# Patient Record
Sex: Male | Born: 1950
Health system: Southern US, Community
[De-identification: ages and names within clinical notes are randomized; demographics above are authoritative.]

## PROBLEM LIST (undated history)

## (undated) DIAGNOSIS — M722 Plantar fascial fibromatosis: Secondary | ICD-10-CM

## (undated) DIAGNOSIS — S060XAA Concussion with loss of consciousness status unknown, initial encounter: Secondary | ICD-10-CM

## (undated) DIAGNOSIS — Z973 Presence of spectacles and contact lenses: Secondary | ICD-10-CM

## (undated) DIAGNOSIS — Z8619 Personal history of other infectious and parasitic diseases: Secondary | ICD-10-CM

## (undated) DIAGNOSIS — J302 Other seasonal allergic rhinitis: Secondary | ICD-10-CM

## (undated) DIAGNOSIS — B1920 Unspecified viral hepatitis C without hepatic coma: Secondary | ICD-10-CM

## (undated) HISTORY — PX: TONSILLECTOMY: SUR1361

## (undated) HISTORY — DX: Concussion with loss of consciousness status unknown, initial encounter: S06.0XAA

## (undated) HISTORY — DX: Plantar fascial fibromatosis: M72.2

## (undated) HISTORY — DX: Personal history of other infectious and parasitic diseases: Z86.19

## (undated) HISTORY — DX: Unspecified viral hepatitis C without hepatic coma: B19.20

## (undated) HISTORY — DX: Other seasonal allergic rhinitis: J30.2

## (undated) HISTORY — DX: Presence of spectacles and contact lenses: Z97.3

## (undated) HISTORY — PX: OTHER SURGICAL HISTORY: SHX169

---

## 1992-10-21 HISTORY — PX: HERNIA REPAIR: SHX51

## 1994-10-21 HISTORY — PX: EYE SURGERY: SHX253

## 2012-10-21 HISTORY — PX: FRACTURE SURGERY: SHX138

## 2013-05-15 DIAGNOSIS — S022XXA Fracture of nasal bones, initial encounter for closed fracture: Secondary | ICD-10-CM | POA: Insufficient documentation

## 2013-05-15 DIAGNOSIS — J939 Pneumothorax, unspecified: Secondary | ICD-10-CM | POA: Insufficient documentation

## 2013-05-15 DIAGNOSIS — D62 Acute posthemorrhagic anemia: Secondary | ICD-10-CM | POA: Insufficient documentation

## 2013-05-15 DIAGNOSIS — S128XXA Fracture of other parts of neck, initial encounter: Secondary | ICD-10-CM | POA: Insufficient documentation

## 2013-05-15 DIAGNOSIS — S129XXA Fracture of neck, unspecified, initial encounter: Secondary | ICD-10-CM | POA: Insufficient documentation

## 2013-05-15 DIAGNOSIS — D72829 Elevated white blood cell count, unspecified: Secondary | ICD-10-CM | POA: Insufficient documentation

## 2013-05-15 DIAGNOSIS — S5291XA Unspecified fracture of right forearm, initial encounter for closed fracture: Secondary | ICD-10-CM | POA: Insufficient documentation

## 2013-05-15 DIAGNOSIS — S42101A Fracture of unspecified part of scapula, right shoulder, initial encounter for closed fracture: Secondary | ICD-10-CM | POA: Insufficient documentation

## 2013-05-15 DIAGNOSIS — E872 Acidosis, unspecified: Secondary | ICD-10-CM | POA: Insufficient documentation

## 2013-05-15 DIAGNOSIS — R739 Hyperglycemia, unspecified: Secondary | ICD-10-CM | POA: Insufficient documentation

## 2013-05-18 DIAGNOSIS — S2249XA Multiple fractures of ribs, unspecified side, initial encounter for closed fracture: Secondary | ICD-10-CM | POA: Insufficient documentation

## 2013-05-18 DIAGNOSIS — S066XAA Traumatic subarachnoid hemorrhage with loss of consciousness status unknown, initial encounter: Secondary | ICD-10-CM | POA: Insufficient documentation

## 2013-05-18 DIAGNOSIS — R0689 Other abnormalities of breathing: Secondary | ICD-10-CM | POA: Insufficient documentation

## 2013-05-18 DIAGNOSIS — S066X1A Traumatic subarachnoid hemorrhage with loss of consciousness of 30 minutes or less, initial encounter: Secondary | ICD-10-CM | POA: Insufficient documentation

## 2013-05-20 DIAGNOSIS — S8253XA Displaced fracture of medial malleolus of unspecified tibia, initial encounter for closed fracture: Secondary | ICD-10-CM | POA: Insufficient documentation

## 2013-07-05 DIAGNOSIS — S52379A Galeazzi's fracture of unspecified radius, initial encounter for closed fracture: Secondary | ICD-10-CM | POA: Insufficient documentation

## 2015-05-18 LAB — HEMOGLOBIN A1C: Hemoglobin A1C: 5.1

## 2015-11-15 DIAGNOSIS — L718 Other rosacea: Secondary | ICD-10-CM | POA: Diagnosis not present

## 2015-11-15 DIAGNOSIS — L219 Seborrheic dermatitis, unspecified: Secondary | ICD-10-CM | POA: Diagnosis not present

## 2015-11-15 DIAGNOSIS — Z08 Encounter for follow-up examination after completed treatment for malignant neoplasm: Secondary | ICD-10-CM | POA: Diagnosis not present

## 2015-11-15 DIAGNOSIS — Z85828 Personal history of other malignant neoplasm of skin: Secondary | ICD-10-CM | POA: Diagnosis not present

## 2016-06-27 DIAGNOSIS — Z125 Encounter for screening for malignant neoplasm of prostate: Secondary | ICD-10-CM | POA: Diagnosis not present

## 2016-06-27 DIAGNOSIS — Z136 Encounter for screening for cardiovascular disorders: Secondary | ICD-10-CM | POA: Diagnosis not present

## 2016-06-27 DIAGNOSIS — Z0000 Encounter for general adult medical examination without abnormal findings: Secondary | ICD-10-CM | POA: Diagnosis not present

## 2016-06-27 DIAGNOSIS — Z131 Encounter for screening for diabetes mellitus: Secondary | ICD-10-CM | POA: Diagnosis not present

## 2017-07-04 DIAGNOSIS — Z136 Encounter for screening for cardiovascular disorders: Secondary | ICD-10-CM | POA: Diagnosis not present

## 2017-07-04 DIAGNOSIS — Z131 Encounter for screening for diabetes mellitus: Secondary | ICD-10-CM | POA: Diagnosis not present

## 2017-07-04 DIAGNOSIS — Z1159 Encounter for screening for other viral diseases: Secondary | ICD-10-CM | POA: Diagnosis not present

## 2017-07-04 DIAGNOSIS — E7889 Other lipoprotein metabolism disorders: Secondary | ICD-10-CM | POA: Diagnosis not present

## 2017-07-04 DIAGNOSIS — Z1211 Encounter for screening for malignant neoplasm of colon: Secondary | ICD-10-CM | POA: Diagnosis not present

## 2017-07-04 DIAGNOSIS — Z0000 Encounter for general adult medical examination without abnormal findings: Secondary | ICD-10-CM | POA: Diagnosis not present

## 2017-07-04 DIAGNOSIS — Z125 Encounter for screening for malignant neoplasm of prostate: Secondary | ICD-10-CM | POA: Diagnosis not present

## 2017-07-04 DIAGNOSIS — M153 Secondary multiple arthritis: Secondary | ICD-10-CM | POA: Diagnosis not present

## 2017-07-04 LAB — LIPID PANEL
Cholesterol: 172 (ref 0–200)
HDL: 77 — AB (ref 35–70)
LDL CALC: 83
Triglycerides: 60 (ref 40–160)

## 2017-07-04 LAB — BASIC METABOLIC PANEL
BUN: 13 (ref 4–21)
Creatinine: 0.8 (ref <=1.3)
Glucose: 99
Potassium: 4.9 (ref 3.4–5.3)
Sodium: 141 (ref 137–147)

## 2017-07-04 LAB — CBC AND DIFFERENTIAL
HCT: 42 (ref 41–53)
Hemoglobin: 14.9 (ref 13.5–17.5)
PLATELETS: 177 (ref 150–399)
WBC: 4.1

## 2017-07-04 LAB — HEPATIC FUNCTION PANEL
ALK PHOS: 59 (ref 25–125)
ALT: 41 — AB (ref 10–40)
AST: 32 (ref 14–40)
BILIRUBIN, TOTAL: 0.8

## 2017-07-04 LAB — PSA: PSA: 2.26

## 2017-07-10 DIAGNOSIS — Z1211 Encounter for screening for malignant neoplasm of colon: Secondary | ICD-10-CM | POA: Diagnosis not present

## 2017-07-10 DIAGNOSIS — R768 Other specified abnormal immunological findings in serum: Secondary | ICD-10-CM | POA: Diagnosis not present

## 2017-07-10 LAB — FECAL OCCULT BLOOD, GUAIAC: FECAL OCCULT BLD: NEGATIVE

## 2017-07-10 LAB — HM HEPATITIS C SCREENING LAB: HM Hepatitis Screen: POSITIVE

## 2017-08-13 DIAGNOSIS — R768 Other specified abnormal immunological findings in serum: Secondary | ICD-10-CM | POA: Diagnosis not present

## 2017-08-13 DIAGNOSIS — B1710 Acute hepatitis C without hepatic coma: Secondary | ICD-10-CM | POA: Diagnosis not present

## 2017-08-13 DIAGNOSIS — Z1211 Encounter for screening for malignant neoplasm of colon: Secondary | ICD-10-CM | POA: Diagnosis not present

## 2017-08-14 LAB — HM HIV SCREENING LAB: HM HIV Screening: NEGATIVE

## 2017-09-25 ENCOUNTER — Ambulatory Visit (INDEPENDENT_AMBULATORY_CARE_PROVIDER_SITE_OTHER): Payer: Medicare Other | Admitting: Internal Medicine

## 2017-09-25 ENCOUNTER — Encounter: Payer: Self-pay | Admitting: Internal Medicine

## 2017-09-25 DIAGNOSIS — B182 Chronic viral hepatitis C: Secondary | ICD-10-CM | POA: Diagnosis present

## 2017-09-25 DIAGNOSIS — B1920 Unspecified viral hepatitis C without hepatic coma: Secondary | ICD-10-CM | POA: Insufficient documentation

## 2017-09-25 NOTE — Progress Notes (Signed)
[facility]   CC: consideration for treatment for chronic hepatitis C  HPI:  +Brandon Bernard is a 66 y.o. male who presents for initial evaluation and management of chronic hepatitis C.  Patient tested positive earlier this year during routine screening. Hepatitis C-associated risk factors present are: none. Patient denies intranasal drug use, IV drug abuse, sexual contact with person with liver disease, tattoos. Patient has had other studies performed. Results: hepatitis C RNA by PCR, result: positive. Patient has not had prior treatment for Hepatitis C. Patient does not have a past history of liver disease. Patient does not have a family history of liver disease. Patient does not  have associated signs or symptoms related to liver disease.  Labs reviewed and confirm chronic hepatitis C with a positive viral load.   Records reviewed from his PCP and has genotype 2 a/c.  Viral load 12,600.  Hepatitis B surface Ag negative. Record reviewed after visit and does not appear to have received the pneumovax or hepatitis vaccines.       Patient does not have documented immunity to Hepatitis A. Patient does not have documented immunity to Hepatitis B.    Review of Systems:   Constitutional: negative for fatigue, malaise and anorexia Gastrointestinal: negative for diarrhea Integument/breast: negative for rash All other systems reviewed and are negative       History reviewed. No pertinent past medical history.  Prior to Admission medications   Not on File    Allergies  Allergen Reactions  . Minocycline     Causes skin on the head of penis to dissolve    Social History   Tobacco Use  . Smoking status: Never Smoker  . Smokeless tobacco: Never Used  Substance Use Topics  . Alcohol use: Yes    Alcohol/week: 0.6 oz    Types: 1 Glasses of wine per week  . Drug use: Not on file    [facility]: no cirrhosis, no liver cancer   Objective:  Constitutional: Awake,  alert, nad,  Vitals:   09/25/17 0931  BP: 132/74  Pulse: 60  Temp: 98.3 F (36.8 C)   Eyes: anicteric Cardiovascular: RRR Respiratory: CTA B; normal respiratory effort Gastrointestinal: soft, nt, nd Musculoskeletal: no edema Skin: No rashes; no porphyria cutanea tarda Lymphatic: no cervical lymphadenopathy   Laboratory Genotype: No results found for: HCVGENOTYPE HCV viral load: No results found for: HCVQUANT No results found for: WBC, HGB, HCT, MCV, PLT No results found for: CREATININE, BUN, NA, K, CL, CO2 No results found for: ALT, AST, GGT, ALKPHOS   Labs and history reviewed and show CHILD-PUGH unknown but normal bili, LFTs, albumin, platelets  5-6 points: Child class A 7-9 points: Child class B 10-15 points: Child class C  No results found for: INR, BILITOT, ALBUMIN   Assessment: New Patient with Chronic Hepatitis C genotype 2, untreated.  I discussed with the patient the lab findings that confirm chronic hepatitis C as well as the natural history and progression of disease including about 30% of people who develop cirrhosis of the liver if left untreated and once cirrhosis is established there is a 2-7% risk per year of liver cancer and liver failure.  I discussed the importance of treatment and benefits in reducing the risk, even if significant liver fibrosis exists.   Plan: 1) Patient counseled extensively on limiting acetaminophen to no more than 2 grams daily, avoidance of alcohol. 2) Transmission discussed with patient including sexual transmission, sharing razors and  toothbrush.   3) Will need referral to gastroenterology if concern for cirrhosis 4) Will need referral for substance abuse counseling: No.; Further work up to include urine drug screen  No. 5) Will prescribe Epclusa for 12 weeks 6) Hepatitis A vaccine Yes.   7) Hepatitis B vaccine Yes.  will start next visit 8) Pneumovax vaccine next visit 9) Further work up to include liver staging with  elastography 10) will follow up after elastography to discuss results.

## 2017-10-01 ENCOUNTER — Ambulatory Visit (HOSPITAL_COMMUNITY)
Admission: RE | Admit: 2017-10-01 | Discharge: 2017-10-01 | Disposition: A | Discharge status: Home / Self Care | Payer: Medicare Other | Source: Ambulatory Visit | Attending: Internal Medicine | Admitting: Internal Medicine

## 2017-10-01 DIAGNOSIS — K7689 Other specified diseases of liver: Secondary | ICD-10-CM | POA: Insufficient documentation

## 2017-10-01 DIAGNOSIS — B182 Chronic viral hepatitis C: Secondary | ICD-10-CM | POA: Diagnosis not present

## 2017-10-21 DIAGNOSIS — B1920 Unspecified viral hepatitis C without hepatic coma: Secondary | ICD-10-CM

## 2017-10-21 HISTORY — DX: Unspecified viral hepatitis C without hepatic coma: B19.20

## 2017-10-30 ENCOUNTER — Encounter: Payer: Self-pay | Admitting: Internal Medicine

## 2017-10-30 ENCOUNTER — Ambulatory Visit (INDEPENDENT_AMBULATORY_CARE_PROVIDER_SITE_OTHER): Payer: Medicare Other | Admitting: Internal Medicine

## 2017-10-30 ENCOUNTER — Telehealth: Payer: Self-pay | Admitting: Pharmacy Technician

## 2017-10-30 ENCOUNTER — Other Ambulatory Visit: Payer: Self-pay

## 2017-10-30 VITALS — BP 136/80 | HR 50 | Temp 98.4°F | Ht 63.0 in | Wt 173.0 lb

## 2017-10-30 DIAGNOSIS — Z7189 Other specified counseling: Secondary | ICD-10-CM

## 2017-10-30 DIAGNOSIS — K7460 Unspecified cirrhosis of liver: Secondary | ICD-10-CM | POA: Diagnosis not present

## 2017-10-30 DIAGNOSIS — K7400 Hepatic fibrosis, unspecified: Secondary | ICD-10-CM | POA: Insufficient documentation

## 2017-10-30 DIAGNOSIS — B182 Chronic viral hepatitis C: Secondary | ICD-10-CM | POA: Diagnosis present

## 2017-10-30 DIAGNOSIS — Z7185 Encounter for immunization safety counseling: Secondary | ICD-10-CM

## 2017-10-30 MED ORDER — SOFOSBUVIR-VELPATASVIR 400-100 MG PO TABS: 1.0000 | ORAL_TABLET | Freq: Every day | ORAL | 2 refills | Status: DC

## 2017-10-30 NOTE — Assessment & Plan Note (Signed)
Will get him on treatment with Epclusa when possible.  He does not have medicare part D so may need that first.  Discussed medication side effects.

## 2017-10-30 NOTE — Telephone Encounter (Signed)
I spoke with Brandon Bernard about whether he plans to get a part D Medicare plan.  He was not planning on it.  I explained the cost of Epclusa for 3 months will be about $75000.  He asked me "what about preexisting conditions"?  I said I was not sure how that would affect him getting insurance.  He said he will consider getting it and if he does will call me.  I asked if I could call him back in a week and he said no, he would prefer to get back with me.

## 2017-10-30 NOTE — Progress Notes (Signed)
   Subjective:    Patient ID: [name], male    DOB: November 18, 1950, 67 y.o.   MRN: [id number]  HPI Here for follow up of chronic hepatitis C.  He has genotype 2, viral load 12,600 and elastography F3/4 with no cirrhosis noted on ultrasound.  Not hepatitis A or B immune.  Has not had pneumovax.  Occasional alcohol.    Review of Systems  Constitutional: Negative for fatigue.  Gastrointestinal: Negative for diarrhea.  Skin: Negative for rash.       Objective:   Physical Exam  Constitutional: He appears well-developed and well-nourished. No distress.  HENT:  Mouth/Throat: No oropharyngeal exudate.  Eyes: No scleral icterus.  Cardiovascular: Normal rate, regular rhythm and normal heart sounds.  No murmur heard. Pulmonary/Chest: Effort normal and breath sounds normal. No respiratory distress.  Skin: No rash noted.          Assessment & Plan:

## 2017-10-30 NOTE — Assessment & Plan Note (Signed)
He will need hepatitis A, B series and pneumovax  Will do pneumovax now and defer the others due to cost concerns.

## 2017-10-30 NOTE — Assessment & Plan Note (Signed)
No overt cirrhosis on ultrasound but F3/4 on elastography.  Will need [place] screening.  I will refer him to GI after tretament, though he is concerned with cost issues so will defer for now

## 2017-10-30 NOTE — Patient Instructions (Signed)
Date 10/30/17  Dear Brandon Bernard, As discussed in the [facility], your hepatitis C therapy will include the following medications:    sofosbuvir 400 mg/velpatasvir 100 mg (Epclusa) oral daily  Please note that ALL MEDICATIONS WILL START ON THE SAME DATE for a total of 12 weeks. ---------------------------------------------------------------- Your HCV Treatment Start Date: TBA   Your HCV genotype: 2/3    Liver Fibrosis: F3/4    ---------------------------------------------------------------- YOUR PHARMACY CONTACT (depending on your insurance):   [facility] [place], [place] [address] Phone: [phone] Hours: Monday to Friday 7:30 am to 6:00 pm   Please always contact your pharmacy at least 3-4 business days before you run out of medications to ensure your next month's medication is ready or 1 week prior to running out if you receive it by mail.  Remember, each prescription is for 28 days. ---------------------------------------------------------------- GENERAL NOTES REGARDING YOUR HEPATITIS C MEDICATION:  SOFOSBUVIR (SOVALDI or EPCLUSA): - Sofosbuvir 400 mg tablet is taken daily with OR without food. - The sofosbuvir tablets are yellow. - The Epclusa tablets are pink, diamond-shaped - The tablets should be stored at room temperature. - The most common side effects with sofosbuvir or Epclusa include:      1. Fatigue      2. Headache      3. Nausea      4. Diarrhea      5. Insomnia  - Acid reducing agents such as H2 blockers (ie. Pepcid (famotidine), Zantac (ranitidine), Tagamet (cimetidine), Axid (nizatidine) and proton pump inhibitors (ie. Prilosec (omeprazole), Protonix (pantoprazole), Nexium (esomeprazole), or Aciphex (rabeprazole)) can decrease effectiveness of Harvoni. Do not take until you have discussed with a health care provider.    -Antacids that contain magnesium and/or aluminum hydroxide (ie. Milk of Magensia, Rolaids, Gaviscon,  Maalox, Mylanta, an dArthritis Pain Formula)can reduce absorption of sofosbuvir, so take them at least 4 hours before or after Harvoni.  -Calcium carbonate (calcium supplements or antacids such as Tums, Caltrate, Os-Cal)needs to be taken at least 4 hours hours before or after sofosbuvir.  -St. John's wort or any products that contain St. John's wort like some herbal supplements  Please inform the office prior to starting any of these medications.   Please note that this only lists the most common side effects and is NOT a comprehensive list of the potential side effects of these medications. For more information, please review the drug information sheets that come with your medication package from the pharmacy.  ---------------------------------------------------------------- GENERAL HELPFUL HINTS ON HCV THERAPY: 1. No alcohol. 2. Stay well-hydrated 3. Notify the ID Clinic of any changes in your other over-the-counter/herbal or prescription medications. 4. If you miss a dose of your medication, take the missed dose as soon as you remember. Return to your regular time/dose schedule the next day.  5.  Do not stop taking your medications without first talking with your healthcare provider. 6.  You may take Tylenol (acetaminophen), as long as the dose is less than 2000 mg (OR no more than 4 tablets of the Tylenol Extra Strengths 500mg  tablet) in 24 hours. 7. You will follow up with our clinic pharmacist initially after starting the medication to monitor for any possible side effects 8. You will get labs once during treatment, soon after treatment completion and again 6 months or more after treatment completion to verify the virus is completely gone.   [name], [facility] [address] [place], [place]  [address] [phone]

## 2018-01-02 ENCOUNTER — Ambulatory Visit (INDEPENDENT_AMBULATORY_CARE_PROVIDER_SITE_OTHER): Payer: Medicare Other | Admitting: Pharmacist Clinician (PhC)/ Clinical Pharmacy Specialist

## 2018-01-02 ENCOUNTER — Encounter: Payer: Self-pay | Admitting: Pharmacy Technician

## 2018-01-02 DIAGNOSIS — B182 Chronic viral hepatitis C: Secondary | ICD-10-CM

## 2018-01-02 NOTE — Progress Notes (Signed)
HPI: Brandon Bernard is a 67 y.o. male who is here to see pharmacy to get meds for his hep C.   No results found for: HCVGENOTYPE, HEPCGENOTYPE  Allergies: Allergies  Allergen Reactions  . Minocycline     Causes skin on the head of penis to dissolve    Vitals:    Past Medical History: No past medical history on file.  Social History: Social History   Socioeconomic History  . Marital status: Married    Spouse name: Not on file  . Number of children: Not on file  . Years of education: Not on file  . Highest education level: Not on file  Social Needs  . Financial resource strain: Not on file  . Food insecurity - worry: Not on file  . Food insecurity - inability: Not on file  . Transportation needs - medical: Not on file  . Transportation needs - non-medical: Not on file  Occupational History  . Not on file  Tobacco Use  . Smoking status: Never Smoker  . Smokeless tobacco: Never Used  Substance and Sexual Activity  . Alcohol use: Yes    Alcohol/week: 0.6 oz    Types: 1 Glasses of wine per week  . Drug use: Not on file  . Sexual activity: Not on file  Other Topics Concern  . Not on file  Social History Narrative  . Not on file    Labs: No results found for: HIV1RNAQUANT, HIV1RNAVL, CD4TABS, HEPBSAB, HEPBSAG, HCVAB  No results found for: HCVGENOTYPE, HEPCGENOTYPE  No flowsheet data found.  No results found for: AST, ALT, INR  CrCl: CrCl cannot be calculated (No order found.).  Fibrosis Score: F3/4 as assessed by ARFI  Child-Pugh Score: Class A  Previous Treatment Regimen: None  Assessment: Brandon Bernard will not have his part D for a while but we have figured out a way to get him 3 mo of Epclusa. We handed him the meds today. He will f/u with [place] in 2 wks for tolerability and at the 1 mo visit for labs. He will come back for the EOT visit. Once he gets his SVR12, the will f/u with Dr. Linus Salmons for the cure visit. All appts have been set up.   Counseled him on  side effects, drug interactions, and adherence today.   Recommendations:  Start Eplcusa 1 PO qday x 12 wks F/u in 2 wks F/u at EOT visit SVR12 the cure visit with Dr. Lorenda Hatchet [place], [place].D., BCPS, AAHIVP Clinical Infectious [facility] 01/02/2018, 10:02 AM

## 2018-01-20 ENCOUNTER — Ambulatory Visit: Payer: Medicare Other | Admitting: Pharmacist Clinician (PhC)/ Clinical Pharmacy Specialist

## 2018-01-20 DIAGNOSIS — B182 Chronic viral hepatitis C: Secondary | ICD-10-CM

## 2018-01-20 NOTE — Progress Notes (Addendum)
HPI: Brandon Bernard is a 67 y.o. male who presents to the pharmacy clinic today to follow-up on his hepatitis C disease. He has genotype 2/3 with an initial viral load of 12, 600.   No results found for: HCVGENOTYPE, HEPCGENOTYPE  Allergies: Allergies  Allergen Reactions  . Minocycline     Causes skin on the head of penis to dissolve    Vitals:    Past Medical History: No past medical history on file.  Social History: Social History   Socioeconomic History  . Marital status: Married    Spouse name: Not on file  . Number of children: Not on file  . Years of education: Not on file  . Highest education level: Not on file  Occupational History  . Not on file  Social Needs  . Financial resource strain: Not on file  . Food insecurity:    Worry: Not on file    Inability: Not on file  . Transportation needs:    Medical: Not on file    Non-medical: Not on file  Tobacco Use  . Smoking status: Never Smoker  . Smokeless tobacco: Never Used  Substance and Sexual Activity  . Alcohol use: Yes    Alcohol/week: 0.6 oz    Types: 1 Glasses of wine per week  . Drug use: Not on file  . Sexual activity: Not on file  Lifestyle  . Physical activity:    Days per week: Not on file    Minutes per session: Not on file  . Stress: Not on file  Relationships  . Social connections:    Talks on phone: Not on file    Gets together: Not on file    Attends religious service: Not on file    Active member of club or organization: Not on file    Attends meetings of clubs or organizations: Not on file    Relationship status: Not on file  Other Topics Concern  . Not on file  Social History Narrative  . Not on file    Labs: No results found for: HIV1RNAQUANT, HIV1RNAVL, CD4TABS, HEPBSAB, HEPBSAG, HCVAB  No results found for: HCVGENOTYPE, HEPCGENOTYPE  No flowsheet data found.  No results found for: AST, ALT, INR  CrCl: CrCl cannot be calculated (No order found.).  Fibrosis  Score: F3/F4 as assessed by ARFI  Child-Pugh Score: A  Previous Treatment Regimen: None  Assessment: Brandon Bernard presents to the pharmacy clinic today to follow-up on his Hepatitis C disease. He picked up 3 months of Epclusa 2 weeks ago and has been doing very well with the medication. He reports no missed doses and takes his medication at the same time every morning. He has not had any side effects.   We had a great discussion with Delante about his disease including how he may have contracted Hepatitis C, his viral load and his fibrosis score. Brandon Bernard was wondering if his viral load reflected how long he had the disease. I explained that the levels of virus can fluctuate with time and unfortunately we can not pinpoint when he contracted the disease based on the amount of virus. He expressed understanding and was grateful for our discussion.   Recommendations: - Continue taking Epclusa every day X 3 months - F/U with pharmacy on 4/15 for 1 month follow-up - F/U with pharmacy on 6/11 for EOT visit  - F/U with Dr. Linus Salmons in September for SVR 12 visit   Jimmy Footman, PharmD, BCPS PGY2 Infectious Diseases Pharmacy Resident Pager:  [phone]  01/20/2018, 9:25 AM   Agreed with Emily's note.  Onnie Boer, PharmD, BCPS, AAHIVP, CPP Infectious Disease Pharmacist Pager: [phone] 01/20/2018 10:38 PM

## 2018-02-02 ENCOUNTER — Ambulatory Visit (INDEPENDENT_AMBULATORY_CARE_PROVIDER_SITE_OTHER): Payer: Medicare Other | Admitting: Pharmacist Clinician (PhC)/ Clinical Pharmacy Specialist

## 2018-02-02 DIAGNOSIS — B182 Chronic viral hepatitis C: Secondary | ICD-10-CM | POA: Diagnosis not present

## 2018-02-02 LAB — COMPLETE METABOLIC PANEL WITH GFR
AG Ratio: 1.4 (calc) (ref 1.0–2.5)
ALBUMIN MSPROF: 4.4 g/dL (ref 3.6–5.1)
ALKALINE PHOSPHATASE (APISO): 54 U/L (ref 40–115)
ALT: 14 U/L (ref 9–46)
AST: 19 U/L (ref 10–35)
BILIRUBIN TOTAL: 0.7 mg/dL (ref 0.2–1.2)
BUN: 11 mg/dL (ref 7–25)
CHLORIDE: 107 mmol/L (ref 98–110)
CO2: 29 mmol/L (ref 20–32)
CREATININE: 0.9 mg/dL (ref 0.70–1.25)
Calcium: 9.7 mg/dL (ref 8.6–10.3)
GFR, Est African American: 103 mL/min/{1.73_m2} (ref >=60)
GFR, Est Non African American: 89 mL/min/{1.73_m2} (ref >=60)
GLUCOSE: 102 mg/dL — AB (ref 65–99)
Globulin: 3.1 g/dL (calc) (ref 1.9–3.7)
Potassium: 4.9 mmol/L (ref 3.5–5.3)
Sodium: 140 mmol/L (ref 135–146)
Total Protein: 7.5 g/dL (ref 6.1–8.1)

## 2018-02-02 LAB — CBC
HCT: 42.5 % (ref 38.5–50.0)
Hemoglobin: 15 g/dL (ref 13.2–17.1)
MCH: 33 pg (ref 27.0–33.0)
MCHC: 35.3 g/dL (ref 32.0–36.0)
MCV: 93.6 fL (ref 80.0–100.0)
MPV: 10.1 fL (ref 7.5–12.5)
PLATELETS: 187 10*3/uL (ref 140–400)
RBC: 4.54 10*6/uL (ref 4.20–5.80)
RDW: 12.8 % (ref 11.0–15.0)
WBC: 4.5 10*3/uL (ref 3.8–10.8)

## 2018-02-02 NOTE — Progress Notes (Signed)
HPI: Brandon Bernard is a 67 y.o. male who is here for his 1 mo hep C visit.   No results found for: HCVGENOTYPE, HEPCGENOTYPE  Allergies: Allergies  Allergen Reactions  . Minocycline     Causes skin on the head of penis to dissolve    Vitals:    Past Medical History: No past medical history on file.  Social History: Social History   Socioeconomic History  . Marital status: Married    Spouse name: Not on file  . Number of children: Not on file  . Years of education: Not on file  . Highest education level: Not on file  Occupational History  . Not on file  Social Needs  . Financial resource strain: Not on file  . Food insecurity:    Worry: Not on file    Inability: Not on file  . Transportation needs:    Medical: Not on file    Non-medical: Not on file  Tobacco Use  . Smoking status: Never Smoker  . Smokeless tobacco: Never Used  Substance and Sexual Activity  . Alcohol use: Yes    Alcohol/week: 0.6 oz    Types: 1 Glasses of wine per week  . Drug use: Not on file  . Sexual activity: Not on file  Lifestyle  . Physical activity:    Days per week: Not on file    Minutes per session: Not on file  . Stress: Not on file  Relationships  . Social connections:    Talks on phone: Not on file    Gets together: Not on file    Attends religious service: Not on file    Active member of club or organization: Not on file    Attends meetings of clubs or organizations: Not on file    Relationship status: Not on file  Other Topics Concern  . Not on file  Social History Narrative  . Not on file    Labs: No results found for: HIV1RNAQUANT, HIV1RNAVL, CD4TABS, HEPBSAB, HEPBSAG, HCVAB  No results found for: HCVGENOTYPE, HEPCGENOTYPE  No flowsheet data found.  No results found for: AST, ALT, INR  CrCl: CrCl cannot be calculated (No order found.).  Fibrosis Score: F3/4 as assessed by ARFI  Child-Pugh Score: Class A  Previous Treatment  Regimen: None  Assessment: Brandon Bernard is here for his 1 month visit with pharmacy. He started on Epclusa on 3/15. He doesn't take any other meds. He has not missed any dose. He does complain of some fatigue after starting the treatment, especially at the gym. Advised him that it's not uncommon to see that side effect on Epclusa. He only has Medicare part A and B. Gave him the [facility] assistance form just in case he gets charge for the labs. We are going to get labs on him today. He will come back for the EOT visit with pharmacy. Since he has an F3/4 score, we will get him to see Dr. Linus Salmons at the cured visit.   Recommendations:  Continue Epclusa 1 PO qday x 12 wks Hep C VL, CMP, CBC today F/u with pharmacy at the EOT visit SVR12 then Dr. Lorenda Hatchet [place], [place].D., BCPS, AAHIVP Clinical Infectious [facility] 02/02/2018, 9:37 AM

## 2018-02-04 LAB — HEPATITIS C RNA QUANTITATIVE
HCV QUANT LOG: NOT DETECTED {Log_IU}/mL
HCV RNA, PCR, QN: NOT DETECTED [IU]/mL

## 2018-03-31 ENCOUNTER — Ambulatory Visit (INDEPENDENT_AMBULATORY_CARE_PROVIDER_SITE_OTHER): Payer: Medicare Other | Admitting: Pharmacist Clinician (PhC)/ Clinical Pharmacy Specialist

## 2018-03-31 DIAGNOSIS — B182 Chronic viral hepatitis C: Secondary | ICD-10-CM | POA: Diagnosis not present

## 2018-03-31 LAB — COMPLETE METABOLIC PANEL WITH GFR
AG RATIO: 1.5 (calc) (ref 1.0–2.5)
ALT: 14 U/L (ref 9–46)
AST: 18 U/L (ref 10–35)
Albumin: 4.7 g/dL (ref 3.6–5.1)
Alkaline phosphatase (APISO): 66 U/L (ref 40–115)
BUN: 14 mg/dL (ref 7–25)
CALCIUM: 10 mg/dL (ref 8.6–10.3)
CO2: 28 mmol/L (ref 20–32)
Chloride: 104 mmol/L (ref 98–110)
Creat: 0.74 mg/dL (ref 0.70–1.25)
GFR, EST AFRICAN AMERICAN: 111 mL/min/{1.73_m2} (ref >=60)
GFR, EST NON AFRICAN AMERICAN: 96 mL/min/{1.73_m2} (ref >=60)
Globulin: 3.2 g/dL (calc) (ref 1.9–3.7)
Glucose, Bld: 110 mg/dL — ABNORMAL HIGH (ref 65–99)
POTASSIUM: 4.9 mmol/L (ref 3.5–5.3)
Sodium: 139 mmol/L (ref 135–146)
TOTAL PROTEIN: 7.9 g/dL (ref 6.1–8.1)
Total Bilirubin: 0.8 mg/dL (ref 0.2–1.2)

## 2018-03-31 LAB — CBC
HCT: 44.5 % (ref 38.5–50.0)
Hemoglobin: 15.5 g/dL (ref 13.2–17.1)
MCH: 32.1 pg (ref 27.0–33.0)
MCHC: 34.8 g/dL (ref 32.0–36.0)
MCV: 92.1 fL (ref 80.0–100.0)
MPV: 10.5 fL (ref 7.5–12.5)
PLATELETS: 195 10*3/uL (ref 140–400)
RBC: 4.83 10*6/uL (ref 4.20–5.80)
RDW: 12.8 % (ref 11.0–15.0)
WBC: 5.1 10*3/uL (ref 3.8–10.8)

## 2018-03-31 NOTE — Progress Notes (Addendum)
HPI: Brandon Bernard is a 67 y.o. male who is here for his 3 month, EOT Hep C visit.   No results found for: HCVGENOTYPE, HEPCGENOTYPE  Allergies: Allergies  Allergen Reactions  . Minocycline     Causes skin on the head of penis to dissolve   Past Medical History: No past medical history on file.  Social History: Social History   Socioeconomic History  . Marital status: Married    Spouse name: Not on file  . Number of children: Not on file  . Years of education: Not on file  . Highest education level: Not on file  Occupational History  . Not on file  Social Needs  . Financial resource strain: Not on file  . Food insecurity:    Worry: Not on file    Inability: Not on file  . Transportation needs:    Medical: Not on file    Non-medical: Not on file  Tobacco Use  . Smoking status: Never Smoker  . Smokeless tobacco: Never Used  Substance and Sexual Activity  . Alcohol use: Yes    Alcohol/week: 0.6 oz    Types: 1 Glasses of wine per week  . Drug use: Not on file  . Sexual activity: Not on file  Lifestyle  . Physical activity:    Days per week: Not on file    Minutes per session: Not on file  . Stress: Not on file  Relationships  . Social connections:    Talks on phone: Not on file    Gets together: Not on file    Attends religious service: Not on file    Active member of club or organization: Not on file    Attends meetings of clubs or organizations: Not on file    Relationship status: Not on file  Other Topics Concern  . Not on file  Social History Narrative  . Not on file   Labs: No results found for: HIV1RNAQUANT, HIV1RNAVL, CD4TABS, HEPBSAB, HEPBSAG, HCVAB  No results found for: HCVGENOTYPE, HEPCGENOTYPE  Hepatitis C RNA quantitative Latest Ref Rng & Units 02/02/2018  HCV Quantitative Log NOT DETECT Log IU/mL <1.18 NOT DETECTED   AST (U/L)  Date Value  02/02/2018 19   ALT (U/L)  Date Value  02/02/2018 14   CrCl: CrCl cannot be calculated  (Patient's most recent lab result is older than the maximum 21 days allowed.).  Fibrosis Score: F 3/4 as assessed by ARFI   Child-Pugh Score: Class A   Previous Treatment Regimen: None  Assessment: Brandon Bernard is here for his 3 month, EOT Hep C visit with pharmacy. He started on Epclusa 01/02/18, last dose was 03/26/18. He does not take any other medications. He was 100% compliant to [place]. He only has Medicare Part A and B. As he is F3/4 score, we will have him see Dr. Linus Salmons at the cure visit.   Recommendations: Hep C VL, CBC, CMP today  F/u with Dr. Linus Salmons for [facility] in September     Bridgett Larsson, [place].D., [facility] 03/31/2018, 9:42 AM   Agreed with Carla Whilden's note. Brandon Bernard is a very compliance person by nature. He is doing well on therapy. His first VL was undetectable.   Onnie Boer, PharmD, BCPS, AAHIVP, CPP Infectious Disease Pharmacist Pager: [phone] 03/31/2018 10:56 AM

## 2018-04-02 LAB — HEPATITIS C RNA QUANTITATIVE
HCV QUANT LOG: NOT DETECTED {Log_IU}/mL
HCV RNA, PCR, QN: <15 IU/mL

## 2018-06-10 ENCOUNTER — Encounter: Payer: Self-pay | Admitting: Physician Assistant

## 2018-06-10 ENCOUNTER — Ambulatory Visit (INDEPENDENT_AMBULATORY_CARE_PROVIDER_SITE_OTHER): Payer: Medicare Other | Admitting: Physician Assistant

## 2018-06-10 ENCOUNTER — Other Ambulatory Visit: Payer: Self-pay

## 2018-06-10 VITALS — BP 122/78 | HR 51 | Temp 97.7°F | Resp 14 | Ht 63.0 in | Wt 170.0 lb

## 2018-06-10 DIAGNOSIS — Z7189 Other specified counseling: Secondary | ICD-10-CM | POA: Diagnosis not present

## 2018-06-10 DIAGNOSIS — B182 Chronic viral hepatitis C: Secondary | ICD-10-CM | POA: Diagnosis not present

## 2018-06-10 DIAGNOSIS — Z0000 Encounter for general adult medical examination without abnormal findings: Secondary | ICD-10-CM | POA: Insufficient documentation

## 2018-06-10 DIAGNOSIS — Z7185 Encounter for immunization safety counseling: Secondary | ICD-10-CM

## 2018-06-10 DIAGNOSIS — Z7689 Persons encountering health services in other specified circumstances: Secondary | ICD-10-CM

## 2018-06-10 NOTE — Assessment & Plan Note (Signed)
Followed by ID - Dr. Linus Salmons. Asymptomatic. Recent Hep C RNA undetectable. Continue care as directed by specialist.  Again declines hep A/ hep B vaccine against medical advice.

## 2018-06-10 NOTE — Assessment & Plan Note (Signed)
No acute concerns today.  Reviewed colon cancer screening options. He has elected to proceed with Cologuard. Order has been placed.  Pneumonia series is up-to-date. Tetanus up-to-date. Declines flu vaccination.

## 2018-06-10 NOTE — Patient Instructions (Addendum)
It was very nice meeting you today. Welcome to [facility]!  I will be getting your records from [place] to review. If you have not had your Hep A and Hep B vaccinations, it is highly advisable you update this due to history of hepatitis C infection.  We will get the Cologuard ordered and sent to your house. Try to complete within 2 weeks and return to the senders. We will call you with your results. If everything looks good, we will need to screen every 3 years. If there is any abnormal finding, that tells [place] we need a further assessment with a colonoscopy.

## 2018-06-10 NOTE — Assessment & Plan Note (Signed)
Declines flu shot. Declines Hep A, Hep B vaccinations despite higher risk. Will continue to discuss at subsequent visits.

## 2018-06-10 NOTE — Progress Notes (Signed)
Patient presents to clinic today to establish care. Patient with history of Hep C and liver cirrhosis 2/2 Hep C infection. Endorses being diagnosed in 07/2017 on routine screening. Is followed by ID (Dr. Linus Salmons) recently completing a course of Epclusa. HCV RNA undetectable at last check pre record review. LFTS within normal limits as well. Denies history of other hepatic insult or infection. Does not drink. Denies having his Hep A or Hep B vaccines. Pneumonia vaccination is up-to-date.  Patent endorses keeping a very well-balanced diet and is keeping active.   Health Maintenance: Immunizations -- Pneumonia and TDaP up-to-date. Declines flu shot. Declines Hep A/Hep B vaccination AMA (history of hep c and liver cirrhosis)  Colon Cancer Screening -- Endorses first screen last year with a hemoocult card. Negative. Declines colonoscopy but is interested in cologuard. Denies bowel changes, melena or hematochezia. Denies known family history of colorectal cancer at young age.   Past Medical History:  Diagnosis Date  . Hepatitis C   . History of chickenpox     Past Surgical History:  Procedure Laterality Date  . EYE SURGERY  1996   Orbital Reconstruction  . FRACTURE SURGERY  2014   Left ankle, Right knee, Right wrist, larynx, Lumbar vertebrae, cervical vertebra  . HERNIA REPAIR  1994    Current Outpatient Medications on File Prior to Visit  Medication Sig Dispense Refill  . Multiple Vitamin (MULTIVITAMIN) tablet Take 1 tablet by mouth daily.     No current facility-administered medications on file prior to visit.     Allergies  Allergen Reactions  . Minocycline     Causes skin on the head of penis to dissolve    History reviewed. No pertinent family history.  Social History   Socioeconomic History  . Marital status: Married    Spouse name: Not on file  . Number of children: Not on file  . Years of education: Not on file  . Highest education level: Not on file  Occupational  History  . Not on file  Social Needs  . Financial resource strain: Not on file  . Food insecurity:    Worry: Not on file    Inability: Not on file  . Transportation needs:    Medical: Not on file    Non-medical: Not on file  Tobacco Use  . Smoking status: Never Smoker  . Smokeless tobacco: Never Used  Substance and Sexual Activity  . Alcohol use: Not Currently    Alcohol/week: 1.0 standard drinks    Types: 1 Glasses of wine per week  . Drug use: Never  . Sexual activity: Yes    Partners: Female  Lifestyle  . Physical activity:    Days per week: Not on file    Minutes per session: Not on file  . Stress: Not on file  Relationships  . Social connections:    Talks on phone: Not on file    Gets together: Not on file    Attends religious service: Not on file    Active member of club or organization: Not on file    Attends meetings of clubs or organizations: Not on file    Relationship status: Not on file  . Intimate partner violence:    Fear of current or ex partner: Not on file    Emotionally abused: Not on file    Physically abused: Not on file    Forced sexual activity: Not on file  Other Topics Concern  . Not on file  Social  History Narrative  . Not on file   Review of Systems  Constitutional: Negative.   Respiratory: Negative for cough, sputum production and shortness of breath.   Cardiovascular: Negative for chest pain and palpitations.  Gastrointestinal: Negative for abdominal pain, blood in stool, constipation, diarrhea, heartburn, melena, nausea and vomiting.  Neurological: Negative.   Psychiatric/Behavioral: Negative.    BP 122/78   Pulse (!) 51   Temp 97.7 F (36.5 C) (Oral)   Resp 14   Ht 5\' 3"  (1.6 m)   Wt 170 lb (77.1 kg)   SpO2 99%   BMI 30.11 kg/m   Physical Exam  Constitutional: He is oriented to person, place, and time. He appears well-developed and well-nourished.  HENT:  Head: Normocephalic and atraumatic.  Eyes: Pupils are equal, round,  and reactive to light. Conjunctivae and EOM are normal.  Neck: Neck supple.  Cardiovascular: Normal rate, regular rhythm, normal heart sounds and intact distal pulses.  Pulmonary/Chest: Effort normal and breath sounds normal.  Lymphadenopathy:    He has no cervical adenopathy.  Neurological: He is alert and oriented to person, place, and time.  Psychiatric: He has a normal mood and affect.  Vitals reviewed.   Recent Results (from the past 2160 hour(s))  COMPLETE METABOLIC PANEL WITH GFR     Status: Abnormal   Collection Time: 03/31/18 10:12 AM  Result Value Ref Range   Glucose, Bld 110 (H) 65 - 99 mg/dL    Comment: .            Fasting reference interval . For someone without known diabetes, a glucose value between 100 and 125 mg/dL is consistent with prediabetes and should be confirmed with a follow-up test. .    BUN 14 7 - 25 mg/dL   Creat 0.74 0.70 - 1.25 mg/dL    Comment: For patients >65 years of age, the reference limit for Creatinine is approximately 13% higher for people identified as African-American. .    GFR, Est Non African American 96 > OR = 60 mL/min/1.60m2   GFR, Est African American 111 > OR = 60 mL/min/1.72m2   BUN/Creatinine Ratio NOT APPLICABLE 6 - 22 (calc)   Sodium 139 135 - 146 mmol/L   Potassium 4.9 3.5 - 5.3 mmol/L   Chloride 104 98 - 110 mmol/L   CO2 28 20 - 32 mmol/L   Calcium 10.0 8.6 - 10.3 mg/dL   Total Protein 7.9 6.1 - 8.1 g/dL   Albumin 4.7 3.6 - 5.1 g/dL   Globulin 3.2 1.9 - 3.7 g/dL (calc)   AG Ratio 1.5 1.0 - 2.5 (calc)   Total Bilirubin 0.8 0.2 - 1.2 mg/dL   Alkaline phosphatase (APISO) 66 40 - 115 U/L   AST 18 10 - 35 U/L   ALT 14 9 - 46 U/L  CBC     Status: None   Collection Time: 03/31/18 10:12 AM  Result Value Ref Range   WBC 5.1 3.8 - 10.8 Thousand/uL   RBC 4.83 4.20 - 5.80 Million/uL   Hemoglobin 15.5 13.2 - 17.1 g/dL   HCT 44.5 38.5 - 50.0 %   MCV 92.1 80.0 - 100.0 fL   MCH 32.1 27.0 - 33.0 pg   MCHC 34.8 32.0 - 36.0  g/dL   RDW 12.8 11.0 - 15.0 %   Platelets 195 140 - 400 Thousand/uL   MPV 10.5 7.5 - 12.5 fL  Hepatitis C RNA quantitative     Status: None   Collection Time: 03/31/18 10:13  AM  Result Value Ref Range   HCV RNA, PCR, QN <15 NOT DETECTED NOT DETECT IU/mL   HCV Quantitative Log <1.18 NOT DETECTED NOT DETECT Log IU/mL    Comment: This test was performed using Real-Time Polymerase Chain Reaction. . Reportable Range: 15 IU/mL to 100,000,000 IU/mL (1.18 Log IU/mL to 8.00 Log IU/mL). . The analytical performance characteristics of this assay have been determined by [facility].  The modifications have not been cleared or approved by the FDA. This assay has been validated pursuant to the  CLIA regulations and is used for clinical purposes.   . For more information on this test, go to: http://education.questdiagnostics.com/faq/FAQ22v1 (This link is being provided for informational/ educational purposes only.) .     Assessment/Plan: Chronic hepatitis C without hepatic coma (HCC) Followed by ID - Dr. Linus Salmons. Asymptomatic. Recent Hep C RNA undetectable. Continue care as directed by specialist.  Again declines hep A/ hep B vaccine against medical advice.  Encounter to establish care No acute concerns today.  Reviewed colon cancer screening options. He has elected to proceed with Cologuard. Order has been placed.  Pneumonia series is up-to-date. Tetanus up-to-date. Declines flu vaccination.  Vaccine counseling Declines flu shot. Declines Hep A, Hep B vaccinations despite higher risk. Will continue to discuss at subsequent visits.    Leeanne Rio, PA-C

## 2018-06-17 ENCOUNTER — Encounter: Payer: Self-pay | Admitting: Emergency Medicine

## 2018-06-17 DIAGNOSIS — Z1211 Encounter for screening for malignant neoplasm of colon: Secondary | ICD-10-CM | POA: Diagnosis not present

## 2018-06-17 DIAGNOSIS — Z1212 Encounter for screening for malignant neoplasm of rectum: Secondary | ICD-10-CM | POA: Diagnosis not present

## 2018-06-18 LAB — COLOGUARD: COLOGUARD: NEGATIVE

## 2018-06-25 ENCOUNTER — Encounter: Payer: Self-pay | Admitting: Physician Assistant

## 2018-06-30 ENCOUNTER — Other Ambulatory Visit: Payer: Medicare Other

## 2018-06-30 DIAGNOSIS — B182 Chronic viral hepatitis C: Secondary | ICD-10-CM | POA: Diagnosis not present

## 2018-07-02 LAB — COMPLETE METABOLIC PANEL WITH GFR
AG RATIO: 1.7 (calc) (ref 1.0–2.5)
ALT: 12 U/L (ref 9–46)
AST: 19 U/L (ref 10–35)
Albumin: 4.5 g/dL (ref 3.6–5.1)
Alkaline phosphatase (APISO): 56 U/L (ref 40–115)
BILIRUBIN TOTAL: 0.5 mg/dL (ref 0.2–1.2)
BUN: 16 mg/dL (ref 7–25)
CHLORIDE: 104 mmol/L (ref 98–110)
CO2: 28 mmol/L (ref 20–32)
Calcium: 9.7 mg/dL (ref 8.6–10.3)
Creat: 0.88 mg/dL (ref 0.70–1.25)
GFR, EST AFRICAN AMERICAN: 104 mL/min/{1.73_m2} (ref >=60)
GFR, Est Non African American: 90 mL/min/{1.73_m2} (ref >=60)
Globulin: 2.7 g/dL (calc) (ref 1.9–3.7)
Glucose, Bld: 105 mg/dL — ABNORMAL HIGH (ref 65–99)
POTASSIUM: 5.1 mmol/L (ref 3.5–5.3)
Sodium: 139 mmol/L (ref 135–146)
TOTAL PROTEIN: 7.2 g/dL (ref 6.1–8.1)

## 2018-07-02 LAB — HEPATITIS C RNA QUANTITATIVE
HCV QUANT LOG: NOT DETECTED {Log_IU}/mL
HCV RNA, PCR, QN: <15 IU/mL

## 2018-07-06 ENCOUNTER — Encounter: Payer: Self-pay | Admitting: Internal Medicine

## 2018-07-06 ENCOUNTER — Ambulatory Visit (INDEPENDENT_AMBULATORY_CARE_PROVIDER_SITE_OTHER): Payer: Medicare Other | Admitting: Internal Medicine

## 2018-07-06 VITALS — BP 147/80 | HR 80 | Temp 98.3°F | Wt 170.0 lb

## 2018-07-06 DIAGNOSIS — Z7189 Other specified counseling: Secondary | ICD-10-CM

## 2018-07-06 DIAGNOSIS — K7460 Unspecified cirrhosis of liver: Secondary | ICD-10-CM | POA: Diagnosis not present

## 2018-07-06 DIAGNOSIS — Z7185 Encounter for immunization safety counseling: Secondary | ICD-10-CM

## 2018-07-06 DIAGNOSIS — B182 Chronic viral hepatitis C: Secondary | ICD-10-CM | POA: Diagnosis not present

## 2018-07-06 NOTE — Assessment & Plan Note (Signed)
Now considered cured.  Discussed positive test on blood screening

## 2018-07-06 NOTE — Assessment & Plan Note (Signed)
Not interested in hepatitis A and B vaccines

## 2018-07-06 NOTE — Assessment & Plan Note (Signed)
Advanced fibrosis on elastography.   Will refer to GI for ? EGD [place] screening now and in 6 months.  rtc 1 year

## 2018-07-06 NOTE — Progress Notes (Signed)
   Subjective:    Patient ID: DEVELLE SIEVERS, male    DOB: 11-04-1950, 67 y.o.   MRN: [id number]  HPI Here for follow up of chronic hepatitis C He has genotype 2, initial viral load of 12,600 and elastography with F3/4.  No cirrhosis on ultrasound.  Received pneumovax and deferred hepatitis A and B vaccines.  No associated fatigue. Improved bruising and rosacea noted.    Review of Systems  Constitutional: Negative for fatigue.  Gastrointestinal: Negative for diarrhea and nausea.  Skin: Negative for rash.       Objective:   Physical Exam  Constitutional: He appears well-developed and well-nourished. No distress.  HENT:  Mouth/Throat: No oropharyngeal exudate.  Cardiovascular: Normal rate, regular rhythm and normal heart sounds.  No murmur heard. Pulmonary/Chest: Effort normal and breath sounds normal. No respiratory distress.  Skin: No rash noted.   SH: no tobacco       Assessment & Plan:

## 2018-07-09 ENCOUNTER — Ambulatory Visit (INDEPENDENT_AMBULATORY_CARE_PROVIDER_SITE_OTHER): Payer: Medicare Other | Admitting: Gastroenterology

## 2018-07-09 ENCOUNTER — Encounter: Payer: Self-pay | Admitting: Gastroenterology

## 2018-07-09 VITALS — BP 118/72 | HR 68 | Ht 63.0 in | Wt 169.5 lb

## 2018-07-09 DIAGNOSIS — R932 Abnormal findings on diagnostic imaging of liver and biliary tract: Secondary | ICD-10-CM | POA: Diagnosis not present

## 2018-07-09 DIAGNOSIS — K7400 Hepatic fibrosis, unspecified: Secondary | ICD-10-CM

## 2018-07-09 DIAGNOSIS — B182 Chronic viral hepatitis C: Secondary | ICD-10-CM | POA: Diagnosis not present

## 2018-07-09 NOTE — Patient Instructions (Addendum)
You have been scheduled for an abdominal ultrasound at [facility] (1st floor of hospital) on9/25/19  at 845am. Please arrive 15 minutes prior to your appointment for registration. Make certain not to have anything to eat or drink 6 hours prior to your appointment. Should you need to reschedule your appointment, please contact radiology at [phone]. This test typically takes about 30 minutes to perform.  Your provider has requested that you go to the basement level for lab in 2 weeks. Press "B" on the elevator. The lab is located at the first door on the left as you exit the elevator.  Thank you for entrusting me with your care and choosing [facility] care.  Dr Rush Landmark
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
## 2018-07-13 ENCOUNTER — Telehealth: Payer: Self-pay | Admitting: Gastroenterology

## 2018-07-13 ENCOUNTER — Encounter: Payer: Self-pay | Admitting: Gastroenterology

## 2018-07-13 DIAGNOSIS — R932 Abnormal findings on diagnostic imaging of liver and biliary tract: Secondary | ICD-10-CM | POA: Insufficient documentation

## 2018-07-14 NOTE — Telephone Encounter (Signed)
Patient states he is returning [facility] call again.

## 2018-07-15 ENCOUNTER — Ambulatory Visit (HOSPITAL_COMMUNITY)
Admission: RE | Admit: 2018-07-15 | Discharge: 2018-07-15 | Disposition: A | Discharge status: Home / Self Care | Payer: Medicare Other | Source: Ambulatory Visit | Attending: Gastroenterology | Admitting: Gastroenterology

## 2018-07-15 DIAGNOSIS — B1920 Unspecified viral hepatitis C without hepatic coma: Secondary | ICD-10-CM | POA: Diagnosis not present

## 2018-07-15 DIAGNOSIS — K7400 Hepatic fibrosis, unspecified: Secondary | ICD-10-CM

## 2018-07-15 DIAGNOSIS — K824 Cholesterolosis of gallbladder: Secondary | ICD-10-CM | POA: Insufficient documentation

## 2018-07-15 DIAGNOSIS — B182 Chronic viral hepatitis C: Secondary | ICD-10-CM | POA: Diagnosis not present

## 2018-07-21 ENCOUNTER — Other Ambulatory Visit: Payer: Self-pay | Admitting: Gastroenterology

## 2018-07-21 ENCOUNTER — Encounter: Payer: Self-pay | Admitting: Gastroenterology

## 2018-07-21 DIAGNOSIS — K7400 Hepatic fibrosis, unspecified: Secondary | ICD-10-CM

## 2018-07-21 NOTE — Progress Notes (Signed)
Left message on machine to call back  

## 2018-07-21 NOTE — Progress Notes (Signed)
Subjective:   Brandon Bernard is a 67 y.o. male who presents for an Initial Medicare Annual Wellness Visit.  Review of Systems  No ROS.  Medicare Wellness Visit. Additional risk factors are reflected in the social history.  Cardiac Risk Factors include: advanced age (>41men, >70 women);male gender;obesity (BMI >30kg/m2)   Sleep patterns: Sleeps 8 hours.  Home Safety/Smoke Alarms: Feels safe in home. Smoke alarms in place.  Living environment; residence and Firearm Safety: Lives with wife in split level home.  Seat Belt Safety/Bike Helmet: Wears seat belt.    Male:   CCS-Cologuard 06/18/2018, negative.   PSA-  Lab Results  Component Value Date   PSA 2.26 07/04/2017      Objective:    Today's Vitals   07/22/18 0859  BP: 140/84  Pulse: 61  Resp: 16  Temp: 98 F (36.7 C)  TempSrc: Temporal  SpO2: 98%  Weight: 169 lb (76.7 kg)  Height: 5\' 3"  (1.6 m)  PainSc: 3    Body mass index is 29.94 kg/m.  Advanced Directives 07/22/2018  Does Patient Have a Medical Advance Directive? Yes  Type of Paramedic of [place];Living will  Copy of [place] in Chart? No - copy requested    Current Medications (verified) Outpatient Encounter Medications as of 07/22/2018  Medication Sig  . Multiple Vitamin (MULTIVITAMIN) tablet Take 1 tablet by mouth daily.   No facility-administered encounter medications on file as of 07/22/2018.     Allergies (verified) Minocycline   History: Past Medical History:  Diagnosis Date  . Hepatitis C   . History of chickenpox    Past Surgical History:  Procedure Laterality Date  . EYE SURGERY  1996   Orbital Reconstruction  . FRACTURE SURGERY  2014   Left ankle, Right knee, Right wrist, larynx, Lumbar vertebrae, cervical vertebra  . HERNIA REPAIR  1994   Family History  Problem Relation Age of Onset  . Esophageal cancer Neg Hx   . Inflammatory bowel disease Neg Hx   . Liver disease Neg Hx   .  Pancreatic cancer Neg Hx   . Stomach cancer Neg Hx    Social History   Socioeconomic History  . Marital status: Married    Spouse name: Not on file  . Number of children: Not on file  . Years of education: Not on file  . Highest education level: Not on file  Occupational History  . Not on file  Social Needs  . Financial resource strain: Not on file  . Food insecurity:    Worry: Not on file    Inability: Not on file  . Transportation needs:    Medical: Not on file    Non-medical: Not on file  Tobacco Use  . Smoking status: Never Smoker  . Smokeless tobacco: Never Used  Substance and Sexual Activity  . Alcohol use: Not Currently    Alcohol/week: 1.0 standard drinks    Types: 1 Glasses of wine per week  . Drug use: Never  . Sexual activity: Yes    Partners: Female  Lifestyle  . Physical activity:    Days per week: Not on file    Minutes per session: Not on file  . Stress: Not on file  Relationships  . Social connections:    Talks on phone: Not on file    Gets together: Not on file    Attends religious service: Not on file    Active member of club or organization:  Not on file    Attends meetings of clubs or organizations: Not on file    Relationship status: Not on file  Other Topics Concern  . Not on file  Social History Narrative  . Not on file   Tobacco Counseling Counseling given: Not Answered   Activities of Daily Living In your present state of health, do you have any difficulty performing the following activities: 07/22/2018 06/10/2018  Hearing? N N  Vision? N N  Difficulty concentrating or making decisions? N N  Walking or climbing stairs? N N  Dressing or bathing? N N  Doing errands, shopping? N N  Preparing Food and eating ? N -  Using the Toilet? N -  In the past six months, have you accidently leaked urine? N -  Do you have problems with loss of bowel control? N -  Managing your Medications? N -  Managing your Finances? N -  Housekeeping or  managing your Housekeeping? N -  Some recent data might be hidden     Immunizations and Health Maintenance Immunization History  Administered Date(s) Administered  . Pneumococcal Polysaccharide-23 10/30/2017  . Tdap 10/22/2011   There are no preventive care reminders to display for this patient.  Patient Care Team: Delorse Limber as PCP - General (Family Medicine) Mansouraty, Telford Nab., MD as Consulting Physician (Gastroenterology) Allyn Kenner, MD (Dermatology) Comer, Okey Regal, MD as Consulting Physician (Infectious Diseases) Henreitta Leber (Dentistry)  Indicate any recent Medical Services you may have received from other than [facility] providers in the past year (date may be approximate).    Assessment:   This is a routine wellness examination for Ryley.  Hearing/Vision screen Hearing Screening Comments: Able to hear conversational tones w/o difficulty. No issues reported.   Vision Screening Comments: Last exam 03/21/2017, every 3 years/vision changes. Dr. Senaida Ores. Wears glasses.   Dietary issues and exercise activities discussed: Current Exercise Habits: Structured exercise class;Home exercise routine(Gardening; yard work (several properties)), Type of exercise: treadmill;strength training/weights, Time (Minutes): 60, Frequency (Times/Week): 7, Weekly Exercise (Minutes/Week): 420, Exercise limited by: None identified   Diet (meal preparation, eat out, water intake, caffeinated beverages, dairy products, fruits and vegetables): Drinks water (seldom), juice, tea (herb). Avoids sodas.   Breakfast: bagel with butter; fruit Lunch: bean dip/chips; cottage cheese/fruit Dinner: lean protein, vegetables  Avoids red meat.   Goals    . Patient Stated     Maintain current health.       Depression Screen PHQ 2/9 Scores 07/22/2018 10/30/2017 09/25/2017  PHQ - 2 Score 0 0 0    Fall Risk Fall Risk  07/22/2018 10/30/2017 09/25/2017  Falls in the past year? No No No      Cognitive Function: MMSE - Mini Mental State Exam 07/22/2018  Orientation to time 5  Orientation to Place 5  Registration 3  Attention/ Calculation 5  Recall 3  Language- name 2 objects 2  Language- repeat 1  Language- follow 3 step command 3  Language- read & follow direction 1  Write a sentence 1  Copy design 1  Total score 30        Screening Tests Health Maintenance  Topic Date Due  . INFLUENZA VACCINE  01/19/2019 (Originally 05/21/2018)  . PNA vac Low Risk Adult (2 of 2 - PCV13) 10/30/2018  . Fecal DNA (Cologuard)  06/18/2021  . DTaP/Tdap/Td (2 - Td) 10/21/2021  . TETANUS/TDAP  10/21/2021  . Hepatitis C Screening  Completed   Declines Flu and Shingrix.  Plan:    Bring a copy of your living will and/or healthcare power of attorney to your next office visit.  Continue doing brain stimulating activities (puzzles, reading, adult coloring books, staying active) to keep memory sharp.   I have personally reviewed and noted the following in the patient's chart:   . Medical and social history . Use of alcohol, tobacco or illicit drugs  . Current medications and supplements . Functional ability and status . Nutritional status . Physical activity . Advanced directives . List of other physicians . Hospitalizations, surgeries, and ER visits in previous 12 months . Vitals . Screenings to include cognitive, depression, and falls . Referrals and appointments  In addition, I have reviewed and discussed with patient certain preventive protocols, quality metrics, and best practice recommendations. A written personalized care plan for preventive services as well as general preventive health recommendations were provided to patient.     Gerilyn Nestle, RN   07/22/2018

## 2018-07-21 NOTE — Progress Notes (Signed)
I have reviewed the patient's chart.  I have also discussed this case with Dr. Linus Salmons from infectious disease.  The degree of F3/F4 fibrosis noted on transient elastography although an imperfect test and his variability in his Apri and fib-4 scores I think it would be reasonable to obtain an HCV fibro-sure.  We will proceed with scheduling an upper endoscopy to evaluate for signs of portal hypertension can be found in patients with high degrees of fibrosis and/or cirrhosis.  I think we will then continue every 6 month [place] screenings with the degree of fibrosis that he has.  I also will consider a one-year follow-up elastography to understand degree of fibrosis that remains post HCV treatment (that would not be due until 1 year post HCV treatment). We will reach out to patient to schedule in the coming weeks and his lab work is pending. Be happy to see him in clinic if he wishes to discuss further.  Brandon Britain, MD [facility] Advanced [facility]oscopy Office # [phone]

## 2018-07-22 ENCOUNTER — Encounter: Payer: Self-pay | Admitting: Physician Assistant

## 2018-07-22 ENCOUNTER — Other Ambulatory Visit (INDEPENDENT_AMBULATORY_CARE_PROVIDER_SITE_OTHER): Payer: Medicare Other

## 2018-07-22 ENCOUNTER — Ambulatory Visit (INDEPENDENT_AMBULATORY_CARE_PROVIDER_SITE_OTHER): Payer: Medicare Other

## 2018-07-22 ENCOUNTER — Other Ambulatory Visit: Payer: Self-pay

## 2018-07-22 ENCOUNTER — Ambulatory Visit (INDEPENDENT_AMBULATORY_CARE_PROVIDER_SITE_OTHER): Payer: Medicare Other | Admitting: Physician Assistant

## 2018-07-22 VITALS — BP 140/84 | HR 61 | Temp 98.0°F | Resp 16 | Ht 63.0 in | Wt 169.0 lb

## 2018-07-22 DIAGNOSIS — M545 Low back pain, unspecified: Secondary | ICD-10-CM

## 2018-07-22 DIAGNOSIS — Z0000 Encounter for general adult medical examination without abnormal findings: Secondary | ICD-10-CM | POA: Diagnosis not present

## 2018-07-22 DIAGNOSIS — E663 Overweight: Secondary | ICD-10-CM

## 2018-07-22 DIAGNOSIS — R7309 Other abnormal glucose: Secondary | ICD-10-CM | POA: Diagnosis not present

## 2018-07-22 DIAGNOSIS — G8929 Other chronic pain: Secondary | ICD-10-CM

## 2018-07-22 DIAGNOSIS — Z125 Encounter for screening for malignant neoplasm of prostate: Secondary | ICD-10-CM | POA: Diagnosis not present

## 2018-07-22 DIAGNOSIS — K7469 Other cirrhosis of liver: Secondary | ICD-10-CM | POA: Diagnosis not present

## 2018-07-22 LAB — COMPREHENSIVE METABOLIC PANEL
ALBUMIN: 4.6 g/dL (ref 3.5–5.2)
ALK PHOS: 55 U/L (ref 39–117)
ALT: 12 U/L (ref 0–53)
AST: 19 U/L (ref 0–37)
BILIRUBIN TOTAL: 0.9 mg/dL (ref 0.2–1.2)
BUN: 11 mg/dL (ref 6–23)
CO2: 29 mEq/L (ref 19–32)
Calcium: 9.9 mg/dL (ref 8.4–10.5)
Chloride: 104 mEq/L (ref 96–112)
Creatinine, Ser: 0.88 mg/dL (ref 0.40–1.50)
GFR: 91.96 mL/min (ref >60.00)
GLUCOSE: 105 mg/dL — AB (ref 70–99)
POTASSIUM: 4.4 meq/L (ref 3.5–5.1)
Sodium: 140 mEq/L (ref 135–145)
TOTAL PROTEIN: 7.8 g/dL (ref 6.0–8.3)

## 2018-07-22 LAB — PSA, MEDICARE: PSA: 2.64 ng/mL (ref 0.10–4.00)

## 2018-07-22 LAB — LIPID PANEL
CHOLESTEROL: 182 mg/dL (ref 0–200)
HDL: 69.2 mg/dL (ref >39.00)
LDL Cholesterol: 96 mg/dL (ref 0–99)
NonHDL: 112.94
TRIGLYCERIDES: 85 mg/dL (ref 0.0–149.0)
Total CHOL/HDL Ratio: 3
VLDL: 17 mg/dL (ref 0.0–40.0)

## 2018-07-22 NOTE — Patient Instructions (Signed)
Please go to the lab for blood work.   Our office will call you with your results unless you have chosen to receive results via MyChart.  If your blood work is normal we will follow-up each year for physicals and as scheduled for chronic medical problems.  If anything is abnormal we will treat accordingly and get you in for a follow-up.  You will be contacted for assessment by Sports Medicine for further assessment of ongoing muscular issues.    Preventive Care 67 Years and Older, Male Preventive care refers to lifestyle choices and visits with your health care provider that can promote health and wellness. What does preventive care include?  A yearly physical exam. This is also called an annual well check.  Dental exams once or twice a year.  Routine eye exams. Ask your health care provider how often you should have your eyes checked.  Personal lifestyle choices, including: ? Daily care of your teeth and gums. ? Regular physical activity. ? Eating a healthy diet. ? Avoiding tobacco and drug use. ? Limiting alcohol use. ? Practicing safe sex. ? Taking low doses of aspirin every day. ? Taking vitamin and mineral supplements as recommended by your health care provider. What happens during an annual well check? The services and screenings done by your health care provider during your annual well check will depend on your age, overall health, lifestyle risk factors, and family history of disease. Counseling Your health care provider may ask you questions about your:  Alcohol use.  Tobacco use.  Drug use.  Emotional well-being.  Home and relationship well-being.  Sexual activity.  Eating habits.  History of falls.  Memory and ability to understand (cognition).  Work and work Statistician.  Screening You may have the following tests or measurements:  Height, weight, and BMI.  Blood pressure.  Lipid and cholesterol levels. These may be checked every 5 years, or  more frequently if you are over 32 years old.  Skin check.  Lung cancer screening. You may have this screening every year starting at age 56 if you have a 30-pack-year history of smoking and currently smoke or have quit within the past 15 years.  Fecal occult blood test (FOBT) of the stool. You may have this test every year starting at age 72.  Flexible sigmoidoscopy or colonoscopy. You may have a sigmoidoscopy every 5 years or a colonoscopy every 10 years starting at age 61.  Prostate cancer screening. Recommendations will vary depending on your family history and other risks.  Hepatitis C blood test.  Hepatitis B blood test.  Sexually transmitted disease (STD) testing.  Diabetes screening. This is done by checking your blood sugar (glucose) after you have not eaten for a while (fasting). You may have this done every 1-3 years.  Abdominal aortic aneurysm (AAA) screening. You may need this if you are a current or former smoker.  Osteoporosis. You may be screened starting at age 58 if you are at high risk.  Talk with your health care provider about your test results, treatment options, and if necessary, the need for more tests. Vaccines Your health care provider may recommend certain vaccines, such as:  Influenza vaccine. This is recommended every year.  Tetanus, diphtheria, and acellular pertussis (Tdap, Td) vaccine. You may need a Td booster every 10 years.  Varicella vaccine. You may need this if you have not been vaccinated.  Zoster vaccine. You may need this after age 53.  Measles, mumps, and rubella (MMR) vaccine.  You may need at least one dose of MMR if you were born in 1957 or later. You may also need a second dose.  Pneumococcal 13-valent conjugate (PCV13) vaccine. One dose is recommended after age 59.  Pneumococcal polysaccharide (PPSV23) vaccine. One dose is recommended after age 68.  Meningococcal vaccine. You may need this if you have certain  conditions.  Hepatitis A vaccine. You may need this if you have certain conditions or if you travel or work in places where you may be exposed to hepatitis A.  Hepatitis B vaccine. You may need this if you have certain conditions or if you travel or work in places where you may be exposed to hepatitis B.  Haemophilus influenzae type b (Hib) vaccine. You may need this if you have certain risk factors.  Talk to your health care provider about which screenings and vaccines you need and how often you need them. This information is not intended to replace advice given to you by your health care provider. Make sure you discuss any questions you have with your health care provider. Document Released: 11/03/2015 Document Revised: 06/26/2016 Document Reviewed: 08/08/2015 Elsevier Interactive Patient Education  [employer].

## 2018-07-22 NOTE — Progress Notes (Signed)
Left message on machine to call back  

## 2018-07-22 NOTE — Patient Instructions (Addendum)
Bring a copy of your living will and/or healthcare power of attorney to your next office visit.  Continue doing brain stimulating activities (puzzles, reading, adult coloring books, staying active) to keep memory sharp.    Health Maintenance, Male A healthy lifestyle and preventive care is important for your health and wellness. Ask your health care provider about what schedule of regular examinations is right for you. What should I know about weight and diet? Eat a Healthy Diet  Eat plenty of vegetables, fruits, whole grains, low-fat dairy products, and lean protein.  Do not eat a lot of foods high in solid fats, added sugars, or salt.  Maintain a Healthy Weight Regular exercise can help you achieve or maintain a healthy weight. You should:  Do at least 150 minutes of exercise each week. The exercise should increase your heart rate and make you sweat (moderate-intensity exercise).  Do strength-training exercises at least twice a week.  Watch Your Levels of Cholesterol and Blood Lipids  Have your blood tested for lipids and cholesterol every 5 years starting at 66 years of age. If you are at high risk for heart disease, you should start having your blood tested when you are 66 years old. You may need to have your cholesterol levels checked more often if: ? Your lipid or cholesterol levels are high. ? You are older than 66 years of age. ? You are at high risk for heart disease.  What should I know about cancer screening? Many types of cancers can be detected early and may often be prevented. Lung Cancer  You should be screened every year for lung cancer if: ? You are a current smoker who has smoked for at least 30 years. ? You are a former smoker who has quit within the past 15 years.  Talk to your health care provider about your screening options, when you should start screening, and how often you should be screened.  Colorectal Cancer  Routine colorectal cancer screening  usually begins at 66 years of age and should be repeated every 5-10 years until you are 66 years old. You may need to be screened more often if early forms of precancerous polyps or small growths are found. Your health care provider may recommend screening at an earlier age if you have risk factors for colon cancer.  Your health care provider may recommend using home test kits to check for hidden blood in the stool.  A small camera at the end of a tube can be used to examine your colon (sigmoidoscopy or colonoscopy). This checks for the earliest forms of colorectal cancer.  Prostate and Testicular Cancer  Depending on your age and overall health, your health care provider may do certain tests to screen for prostate and testicular cancer.  Talk to your health care provider about any symptoms or concerns you have about testicular or prostate cancer.  Skin Cancer  Check your skin from head to toe regularly.  Tell your health care provider about any new moles or changes in moles, especially if: ? There is a change in a mole's size, shape, or color. ? You have a mole that is larger than a pencil eraser.  Always use sunscreen. Apply sunscreen liberally and repeat throughout the day.  Protect yourself by wearing long sleeves, pants, a wide-brimmed hat, and sunglasses when outside.  What should I know about heart disease, diabetes, and high blood pressure?  If you are 18-39 years of age, have your blood pressure checked every   3-5 years. If you are 40 years of age or older, have your blood pressure checked every year. You should have your blood pressure measured twice-once when you are at a hospital or clinic, and once when you are not at a hospital or clinic. Record the average of the two measurements. To check your blood pressure when you are not at a hospital or clinic, you can use: ? An automated blood pressure machine at a pharmacy. ? A home blood pressure monitor.  Talk to your health care  provider about your target blood pressure.  If you are between 45-79 years old, ask your health care provider if you should take aspirin to prevent heart disease.  Have regular diabetes screenings by checking your fasting blood sugar level. ? If you are at a normal weight and have a low risk for diabetes, have this test once every three years after the age of 45. ? If you are overweight and have a high risk for diabetes, consider being tested at a younger age or more often.  A one-time screening for abdominal aortic aneurysm (AAA) by ultrasound is recommended for men aged 65-75 years who are current or former smokers. What should I know about preventing infection? Hepatitis B If you have a higher risk for hepatitis B, you should be screened for this virus. Talk with your health care provider to find out if you are at risk for hepatitis B infection. Hepatitis C Blood testing is recommended for:  Everyone born from 1945 through 1965.  Anyone with known risk factors for hepatitis C.  Sexually Transmitted Diseases (STDs)  You should be screened each year for STDs including gonorrhea and chlamydia if: ? You are sexually active and are younger than 66 years of age. ? You are older than 66 years of age and your health care provider tells you that you are at risk for this type of infection. ? Your sexual activity has changed since you were last screened and you are at an increased risk for chlamydia or gonorrhea. Ask your health care provider if you are at risk.  Talk with your health care provider about whether you are at high risk of being infected with HIV. Your health care provider may recommend a prescription medicine to help prevent HIV infection.  What else can I do?  Schedule regular health, dental, and eye exams.  Stay current with your vaccines (immunizations).  Do not use any tobacco products, such as cigarettes, chewing tobacco, and e-cigarettes. If you need help quitting, ask  your health care provider.  Limit alcohol intake to no more than 2 drinks per day. One drink equals 12 ounces of beer, 5 ounces of wine, or 1 ounces of hard liquor.  Do not use street drugs.  Do not share needles.  Ask your health care provider for help if you need support or information about quitting drugs.  Tell your health care provider if you often feel depressed.  Tell your health care provider if you have ever been abused or do not feel safe at home. This information is not intended to replace advice given to you by your health care provider. Make sure you discuss any questions you have with your health care provider. Document Released: 04/04/2008 Document Revised: 06/05/2016 Document Reviewed: 07/11/2015 Elsevier Interactive Patient Education  2018 [employer]  
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
## 2018-07-22 NOTE — Progress Notes (Signed)
The patient has been notified of this information and all questions answered.  The pt has been advised of the information and verbalized understanding.   The pt has been scheduled for pre visit and EGD

## 2018-07-22 NOTE — Progress Notes (Signed)
RN MWV note reviewed.  Arriyah Madej Cody Geraldo Haris, PA-C  

## 2018-07-23 LAB — HEMOGLOBIN A1C: HEMOGLOBIN A1C: 5.3 % (ref 4.6–6.5)

## 2018-07-24 ENCOUNTER — Other Ambulatory Visit (INDEPENDENT_AMBULATORY_CARE_PROVIDER_SITE_OTHER): Payer: Medicare Other

## 2018-07-24 DIAGNOSIS — B182 Chronic viral hepatitis C: Secondary | ICD-10-CM | POA: Diagnosis not present

## 2018-07-24 DIAGNOSIS — K7400 Hepatic fibrosis, unspecified: Secondary | ICD-10-CM

## 2018-07-24 LAB — HEPATIC FUNCTION PANEL
ALT: 12 U/L (ref 0–53)
AST: 20 U/L (ref 0–37)
Albumin: 4.5 g/dL (ref 3.5–5.2)
Alkaline Phosphatase: 51 U/L (ref 39–117)
BILIRUBIN DIRECT: 0.2 mg/dL (ref 0.0–0.3)
BILIRUBIN TOTAL: 0.9 mg/dL (ref 0.2–1.2)
TOTAL PROTEIN: 7.9 g/dL (ref 6.0–8.3)

## 2018-07-24 LAB — BASIC METABOLIC PANEL
BUN: 14 mg/dL (ref 6–23)
CHLORIDE: 103 meq/L (ref 96–112)
CO2: 26 meq/L (ref 19–32)
CREATININE: 0.9 mg/dL (ref 0.40–1.50)
Calcium: 10 mg/dL (ref 8.4–10.5)
GFR: 89.6 mL/min (ref >60.00)
GLUCOSE: 104 mg/dL — AB (ref 70–99)
Potassium: 4.4 mEq/L (ref 3.5–5.1)
Sodium: 139 mEq/L (ref 135–145)

## 2018-07-24 LAB — PROTIME-INR
INR: 1.1 ratio — ABNORMAL HIGH (ref 0.8–1.0)
Prothrombin Time: 12.6 s (ref 9.6–13.1)

## 2018-07-27 LAB — AFP TUMOR MARKER: AFP TUMOR MARKER: 1.9 ng/mL (ref <6.1)

## 2018-07-27 LAB — HEPATITIS A ANTIBODY, TOTAL: HEPATITIS A AB,TOTAL: NONREACTIVE

## 2018-07-27 LAB — IGG: IGG (IMMUNOGLOBIN G), SERUM: 1365 mg/dL (ref 600–1540)

## 2018-07-27 LAB — HEPATITIS B CORE ANTIBODY, TOTAL: HEP B C TOTAL AB: NONREACTIVE

## 2018-07-27 LAB — HEPATITIS B SURFACE ANTIBODY,QUALITATIVE: HEP B S AB: NONREACTIVE

## 2018-07-27 LAB — HEPATITIS B SURFACE ANTIGEN: Hepatitis B Surface Ag: NONREACTIVE

## 2018-07-28 ENCOUNTER — Telehealth: Payer: Self-pay | Admitting: Physician Assistant

## 2018-07-28 DIAGNOSIS — G8929 Other chronic pain: Secondary | ICD-10-CM

## 2018-07-28 DIAGNOSIS — M545 Low back pain, unspecified: Secondary | ICD-10-CM

## 2018-07-28 LAB — HCV FIBROSURE
ALPHA 2-MACROGLOBULINS, QN: 342 mg/dL — ABNORMAL HIGH (ref 110–276)
ALT (SGPT) P5P: 15 IU/L (ref 0–55)
APOLIPOPROTEIN A I: 184 mg/dL — AB (ref 101–178)
BILIRUBIN, TOTAL: 0.2 mg/dL (ref 0.0–1.2)
Fibrosis Score: 0.2 (ref 0.00–0.21)
GGT: 16 IU/L (ref 0–65)
HAPTOGLOBIN: 185 mg/dL (ref 34–200)
Necroinflammat Activity Score: 0.04 (ref 0.00–0.17)

## 2018-07-28 NOTE — Telephone Encounter (Signed)
Referral has been placed. 

## 2018-07-28 NOTE — Telephone Encounter (Signed)
Copied from [place] [id number]. Topic: Referral - Request >> Jul 28, 2018  1:47 PM Gardiner Ramus wrote: Reason for CRM: pt called and stated that Dr Elyn Aquas was going to send in a referral to a sports medicine specialist. Pt states referral has not been sent in.

## 2018-07-29 NOTE — Telephone Encounter (Signed)
I have scheduled pt with Dr Paulla Fore for his referral. Nothing further needed. Pt has appt 07/30/18

## 2018-07-30 ENCOUNTER — Ambulatory Visit (INDEPENDENT_AMBULATORY_CARE_PROVIDER_SITE_OTHER): Payer: Medicare Other | Admitting: Sports Medicine

## 2018-07-30 ENCOUNTER — Encounter: Payer: Self-pay | Admitting: Sports Medicine

## 2018-07-30 ENCOUNTER — Ambulatory Visit (INDEPENDENT_AMBULATORY_CARE_PROVIDER_SITE_OTHER): Payer: Medicare Other

## 2018-07-30 VITALS — BP 128/72 | HR 56 | Ht 63.0 in | Wt 168.8 lb

## 2018-07-30 DIAGNOSIS — M545 Low back pain, unspecified: Secondary | ICD-10-CM

## 2018-07-30 DIAGNOSIS — G8929 Other chronic pain: Secondary | ICD-10-CM | POA: Diagnosis not present

## 2018-07-30 DIAGNOSIS — M47816 Spondylosis without myelopathy or radiculopathy, lumbar region: Secondary | ICD-10-CM | POA: Diagnosis not present

## 2018-07-30 DIAGNOSIS — R29898 Other symptoms and signs involving the musculoskeletal system: Secondary | ICD-10-CM | POA: Diagnosis not present

## 2018-07-30 DIAGNOSIS — Z981 Arthrodesis status: Secondary | ICD-10-CM | POA: Diagnosis not present

## 2018-07-30 DIAGNOSIS — M24551 Contracture, right hip: Secondary | ICD-10-CM | POA: Diagnosis not present
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
## 2018-07-30 NOTE — Patient Instructions (Addendum)
Also check out [employer]" which is a program developed by Dr. Minerva Ends.   There are links to a couple of his YouTube Videos below and I would like to see performing one of his videos 5-6 days per week.    A good intro video is: "Independence from Pain 7-minute Video" - [email or web address]   Exercises that focus more on the neck are as below: Dr. Archie Balboa with [place] teaching neck and shoulder details Part 1 - https://[email or web address].[email or web address] Part 2 Dr. Archie Balboa with [facility] quick routine to practice daily - https://[email or web address].be/[email or web address]  Do not try to attempt the entire video when first beginning.    Try breaking of each exercise that he goes into shorter segments.  Otherwise if they perform an exercise for 45 seconds, start with 15 seconds and rest and then resume when they begin the new activity.  If you work your way up to being able to do these videos without having to stop, I expect you will see significant improvements in your pain.  If you enjoy his videos and would like to find out more you can look on his website: [email or web address]  He has a workout streaming option as well as a DVD set available for purchase.  Amazon has the best price for his DVDs.     Please perform the exercise program that we have prepared for you and gone over in detail on a daily basis.  In addition to the handout you were provided you can access your program through: [email or web address]   Your unique program code is:  [email or web address]

## 2018-07-31 ENCOUNTER — Encounter: Payer: Self-pay | Admitting: Sports Medicine

## 2018-07-31 NOTE — Progress Notes (Signed)
PROCEDURE NOTE: THERAPEUTIC EXERCISES (97110) 15 minutes spent for Therapeutic exercises as below and as referenced in the AVS.  This included exercises focusing on stretching, strengthening, with significant focus on eccentric aspects.   Proper technique shown and discussed handout in great detail with ATC.  All questions were discussed and answered.   Long term goals include an improvement in range of motion, strength, endurance as well as avoiding reinjury. Frequency of visits is one time as determined during today's  office visit. Frequency of exercises to be performed is as per handout.  EXERCISES REVIEWED:  Brandon Bernard Exercises  Pelvic recruitment  Hip flexor stretching

## 2018-08-04 ENCOUNTER — Ambulatory Visit (AMBULATORY_SURGERY_CENTER): Payer: Self-pay | Admitting: *Deleted

## 2018-08-04 ENCOUNTER — Other Ambulatory Visit: Payer: Self-pay

## 2018-08-04 VITALS — Ht 62.0 in | Wt 172.6 lb

## 2018-08-04 DIAGNOSIS — K7400 Hepatic fibrosis, unspecified: Secondary | ICD-10-CM

## 2018-08-04 DIAGNOSIS — B182 Chronic viral hepatitis C: Secondary | ICD-10-CM

## 2018-08-04 NOTE — Progress Notes (Signed)
Patient denies any allergies to egg or soy products. Patient denies complications with anesthesia/sedation.  Patient denies oxygen use at home and denies diet medications. Pamphlet given on endoscopy.  

## 2018-08-18 ENCOUNTER — Encounter: Payer: Self-pay | Admitting: Gastroenterology

## 2018-08-18 ENCOUNTER — Ambulatory Visit (AMBULATORY_SURGERY_CENTER): Payer: Medicare Other | Admitting: Gastroenterology

## 2018-08-18 VITALS — BP 113/71 | HR 62 | Temp 97.7°F | Resp 18 | Ht 63.0 in | Wt 168.0 lb

## 2018-08-18 DIAGNOSIS — K259 Gastric ulcer, unspecified as acute or chronic, without hemorrhage or perforation: Secondary | ICD-10-CM | POA: Diagnosis not present

## 2018-08-18 DIAGNOSIS — K209 Esophagitis, unspecified without bleeding: Secondary | ICD-10-CM

## 2018-08-18 DIAGNOSIS — K738 Other chronic hepatitis, not elsewhere classified: Secondary | ICD-10-CM | POA: Diagnosis not present

## 2018-08-18 DIAGNOSIS — B182 Chronic viral hepatitis C: Secondary | ICD-10-CM

## 2018-08-18 DIAGNOSIS — K3189 Other diseases of stomach and duodenum: Secondary | ICD-10-CM | POA: Diagnosis not present

## 2018-08-18 DIAGNOSIS — K7400 Hepatic fibrosis, unspecified: Secondary | ICD-10-CM

## 2018-08-18 MED ORDER — SODIUM CHLORIDE 0.9 % IV SOLN: 500.0000 mL | Freq: Once | INTRAVENOUS | Status: DC

## 2018-08-18 MED ORDER — OMEPRAZOLE 40 MG PO CPDR: 40.0000 mg | DELAYED_RELEASE_CAPSULE | Freq: Every day | ORAL | 3 refills | Status: DC

## 2018-08-18 MED FILL — OMEPRAZOLE 40 MG CPDR: 40 | 90 days supply | Qty: 90 | Fill #0

## 2018-08-18 NOTE — Op Note (Signed)
[facility] Patient Name: Brandon Bernard Procedure Date: 08/18/2018 7:42 AM MRN: [id number] Endoscopist: Justice Britain , MD Age: 67 Referring MD:  Date of Birth: 24-Nov-1950 Gender: Male Account #: [id number] Procedure:                Upper GI endoscopy Indications:              Portal hypertension rule out esophageal varices Medicines:                Monitored Anesthesia Care Procedure:                Pre-Anesthesia Assessment:                           - Prior to the procedure, a History and Physical                            was performed, and patient medications and                            allergies were reviewed. The patient's tolerance of                            previous anesthesia was also reviewed. The risks                            and benefits of the procedure and the sedation                            options and risks were discussed with the patient.                            All questions were answered, and informed consent                            was obtained. Prior Anticoagulants: The patient has                            taken no previous anticoagulant or antiplatelet                            agents. ASA Grade Assessment: II - A patient with                            mild systemic disease. After reviewing the risks                            and benefits, the patient was deemed in                            satisfactory condition to undergo the procedure.                           After obtaining informed consent, the endoscope was  passed under direct vision. Throughout the                            procedure, the patient's blood pressure, pulse, and                            oxygen saturations were monitored continuously. The                            Model [id number]-[id number]) scope was introduced                            through the mouth, and advanced to the second part                            of  duodenum. The upper GI endoscopy was                            accomplished without difficulty. The patient                            tolerated the procedure. Scope In: Scope Out: Findings:                 No gross lesions were noted in the proximal                            esophagus and in the mid esophagus.                           LA Grade C (one or more mucosal breaks continuous                            between tops of 2 or more mucosal folds, less than                            75% circumference) esophagitis with no bleeding was                            found in the distal esophagus extending to the [place]. This was biopsied with a cold forceps for                            histology.                           The Z-line was regular and was found 40 cm from the                            incisors.                           There is  no endoscopic evidence of varices in the                            entire esophagus.                           A few dispersed, diminutive non-bleeding erosions                            were found on the lesser curvature of the stomach.                            There were no stigmata of recent bleeding.                           No other gross lesions were noted in the entire                            examined stomach including portal hypertensive                            gastropathy or varices. Biopsies were taken with a                            cold forceps for histology and Helicobacter pylori                            testing from the antrum/incisura/greater                            curve/lesser curve.                           No gross lesions were noted in the duodenal bulb,                            in the first portion of the duodenum and in the                            second portion of the duodenum. Complications:            No immediate complications. Estimated Blood Loss:      Estimated blood loss was minimal. Impression:               - No gross lesions in esophagus.                           - LA Grade C esophagitis. Biopsied to rule in/out                            Barrett's.                           - Z-line regular, 40 cm from the incisors.                           -  Non-bleeding erosive gastropathy.                           - No gross lesions in the stomach. Biopsied.                           - No gross lesions in the duodenal bulb, in the                            first portion of the duodenum and in the second                            portion of the duodenum. Recommendation:           - The patient will be observed post-procedure,                            until all discharge criteria are met.                           - Discharge patient to home.                           - Patient has a contact number available for                            emergencies. The signs and symptoms of potential                            delayed complications were discussed with the                            patient. Return to normal activities tomorrow.                            Written discharge instructions were provided to the                            patient.                           - Resume previous diet.                           - Await pathology results.                           - Begin use of Omeprazole 40 mg Daily for 57-month                            and then decrease to 20 mg Daily for 22-month                           - No aspirin, ibuprofen, naproxen, or other  non-steroidal anti-inflammatory drugs.                           - Repeat EGD in 3-4 months to ensure esophagitis                            has healed and/or depending on results of biopsies                            repeat or confirm in case Barrett's esophagus is                            found.                           - The findings and  recommendations were discussed                            with the patient.                           - The findings and recommendations were discussed                            with the patient's family. Justice Britain, MD 08/18/2018 8:16:53 AM

## 2018-08-18 NOTE — Progress Notes (Signed)
To PACU, VSS. Report to RN.tb 

## 2018-08-18 NOTE — Progress Notes (Signed)
Pt's states no medical or surgical changes since previsit or office visit. 

## 2018-08-18 NOTE — Progress Notes (Signed)
Called to room to assist during endoscopic procedure.  Patient ID and intended procedure confirmed with present staff. Received instructions for my participation in the procedure from the performing physician.  

## 2018-08-18 NOTE — Patient Instructions (Signed)
YOU HAD AN ENDOSCOPIC PROCEDURE TODAY AT [facility]:   Refer to the procedure report that was given to you for any specific questions about what was found during the examination.  If the procedure report does not answer your questions, please call your gastroenterologist to clarify.  If you requested that your care partner not be given the details of your procedure findings, then the procedure report has been included in a sealed envelope for you to review at your convenience later.  YOU SHOULD EXPECT: Some feelings of bloating in the abdomen. Passage of more gas than usual.  Walking can help get rid of the air that was put into your GI tract during the procedure and reduce the bloating. If you had a lower endoscopy (such as a colonoscopy or flexible sigmoidoscopy) you may notice spotting of blood in your stool or on the toilet paper. If you underwent a bowel prep for your procedure, you may not have a normal bowel movement for a few days.  Please Note:  You might notice some irritation and congestion in your nose or some drainage.  This is from the oxygen used during your procedure.  There is no need for concern and it should clear up in a day or so.  SYMPTOMS TO REPORT IMMEDIATELY:   Following upper endoscopy (EGD)  Vomiting of blood or coffee ground material  New chest pain or pain under the shoulder blades  Painful or persistently difficult swallowing  New shortness of breath  Fever of 100F or higher  Black, tarry-looking stools  For urgent or emergent issues, a gastroenterologist can be reached at any hour by calling [phone].   DIET:  We do recommend a small meal at first, but then you may proceed to your regular diet.  Drink plenty of fluids but you should avoid alcoholic beverages for 24 hours.  MEDICATIONS: Begin use of Omeprazole 40 mg by mouth daily for 2 months and then decrease to 20 mg daily for 2 months. No Aspirin, Ibuprofen, Naproxen, or other  non-steroidal anti-inflammatory drugs.  Follow up: Repeat EGD in 3-4 months to ensure esophagitis has healed and/or depending on results of biopsies repeat or confirm in case Barrett's esophagus is found.  Please see handouts given to you by your recovery nurse.  ACTIVITY:  You should plan to take it easy for the rest of today and you should NOT DRIVE or use heavy machinery until tomorrow (because of the sedation medicines used during the test).    FOLLOW UP: Our staff will call the number listed on your records the next business day following your procedure to check on you and address any questions or concerns that you may have regarding the information given to you following your procedure. If we do not reach you, we will leave a message.  However, if you are feeling well and you are not experiencing any problems, there is no need to return our call.  We will assume that you have returned to your regular daily activities without incident.  If any biopsies were taken you will be contacted by phone or by letter within the next 1-3 weeks.  Please call us at [phone] if you have not heard about the biopsies in 3 weeks.   Thank you for allowing [place] to provide for your healthcare needs today.  SIGNATURES/CONFIDENTIALITY: You and/or your care partner have signed paperwork which will be entered into your electronic medical record.  These signatures attest to the fact  that that the information above on your After Visit Summary has been reviewed and is understood.  Full responsibility of the confidentiality of this discharge information lies with you and/or your care-partner.

## 2018-08-19 ENCOUNTER — Telehealth: Payer: Self-pay | Admitting: *Deleted

## 2018-08-19 NOTE — Telephone Encounter (Signed)
  Follow up Call-  Call back number 08/18/2018  Post procedure Call Back phone  # [phone]  Permission to leave phone message Yes  Some recent data might be hidden     Patient questions:  Do you have a fever, pain , or abdominal swelling? No. Pain Score  0 *  Have you tolerated food without any problems? Yes.    Have you been able to return to your normal activities? Yes.    Do you have any questions about your discharge instructions: Diet   No. Medications  No. Follow up visit  No.  Do you have questions or concerns about your Care? No.  Actions: * If pain score is 4 or above: No action needed, pain <4.

## 2018-08-27 ENCOUNTER — Encounter: Payer: Self-pay | Admitting: Gastroenterology

## 2018-09-10 ENCOUNTER — Ambulatory Visit: Payer: Medicare Other | Admitting: Sports Medicine

## 2018-09-11 ENCOUNTER — Encounter: Payer: Self-pay | Admitting: Gastroenterology

## 2018-09-11 ENCOUNTER — Ambulatory Visit (INDEPENDENT_AMBULATORY_CARE_PROVIDER_SITE_OTHER): Payer: Medicare Other | Admitting: Gastroenterology

## 2018-09-11 VITALS — BP 142/84 | HR 75 | Ht 63.0 in | Wt 169.4 lb

## 2018-09-11 DIAGNOSIS — B1920 Unspecified viral hepatitis C without hepatic coma: Secondary | ICD-10-CM | POA: Diagnosis not present

## 2018-09-11 DIAGNOSIS — K209 Esophagitis, unspecified without bleeding: Secondary | ICD-10-CM

## 2018-09-11 DIAGNOSIS — K7400 Hepatic fibrosis, unspecified: Secondary | ICD-10-CM

## 2018-09-11 NOTE — Patient Instructions (Signed)
You will be due for a recall EGD in 11/2018. We will send you a reminder in the mail when it gets closer to that time.

## 2018-09-12 ENCOUNTER — Encounter: Payer: Self-pay | Admitting: Gastroenterology

## 2018-09-17 ENCOUNTER — Encounter: Payer: Self-pay | Admitting: Gastroenterology

## 2018-09-17 DIAGNOSIS — K209 Esophagitis, unspecified without bleeding: Secondary | ICD-10-CM | POA: Insufficient documentation

## 2018-09-17 NOTE — Progress Notes (Signed)
[facility] VISIT   Primary Care Provider Brunetta Jeans, PA-C [address] [place] [address] [phone]  Referring Provider Brunetta Jeans, PA-C [address] [place], [place] [address] [phone]  Patient Profile: Brandon Bernard is a 67 y.o. male with a pmh significant for HCV-GT2 (s/p treatment) with possible advanced fibrosis (based on elastography but incongruent based on lab studies).  The patient presents to the [facility] for an evaluation and management of problem(s) noted below:  Problem List 1. Liver fibrosis   2. Hepatitis C virus infection without hepatic coma, unspecified chronicity   3. Acute esophagitis     History of Present Illness: Please see initial consultation note for full details of HPI   Interval History: Since her last visit we obtained repeat liver ultrasound imaging to evaluate for possible HCC and we also proceeded with an upper endoscopy to evaluate for signs or symptoms of clinical portal hypertension (varices or portal gastropathy).  Results are below but were essentially normal other than some esophagitis that we found with no evidence of varices in the stomach or esophagus.  The ultrasound imaging did not show any evidence of HCC.  We sent a HCV FibroSure that returned suggesting 0 staging of fibrosis.  We also found that he was hepatitis A and hepatitis B not immune and we considered vaccination though he held on that.  Today, the patient returns for scheduled follow-up and is feeling and doing well.  He has no GI complaints.  The patient denies any issues with jaundice, scleral icterus, pruritus, darkened/amber urine, clay-colored stools, LE edema, hematemesis, coffee-ground emesis, abdominal distention, confusion, new generalized pruritus.  He has been taking his PPI on a daily basis.  He describes no dysphagia.  GI Review of Systems Positive as above Negative for odynophagia,  abdominal pain, melena, hematochezia  Review of Systems General: Denies fevers/chills/weight loss Cardiovascular: Denies chest pain Pulmonary: Denies shortness of breath Gastroenterological: See HPI Genitourinary: Denies darkened urine Dermatological: Denies jaundice Psychological: Mood is stable   Medications Current Outpatient Medications  Medication Sig Dispense Refill  . Multiple Vitamin (MULTIVITAMIN) tablet Take 1 tablet by mouth daily.    Marland Kitchen omeprazole (PRILOSEC) 40 MG capsule Take 1 capsule (40 mg total) by mouth daily. 90 capsule 3   No current facility-administered medications for this visit.     Allergies Allergies  Allergen Reactions  . Minocycline Other (See Comments)    Candidiasis    Histories Past Medical History:  Diagnosis Date  . Hepatitis C 2019   treated in Spring 2019   . History of chickenpox   . Seasonal allergies   . Wears glasses    Past Surgical History:  Procedure Laterality Date  . EYE SURGERY Left 1996   Orbital Reconstruction  . FRACTURE SURGERY  2014   Left ankle, Right knee, Right wrist, larynx, Lumbar vertebrae, cervical vertebra  . HERNIA REPAIR  1994  . TONSILLECTOMY    . wisdom teeth ext     Social History   Socioeconomic History  . Marital status: Married    Spouse name: Not on file  . Number of children: Not on file  . Years of education: Not on file  . Highest education level: Not on file  Occupational History  . Not on file  Social Needs  . Financial resource strain: Not on file  . Food insecurity:    Worry: Not on file    Inability: Not  on file  . Transportation needs:    Medical: Not on file    Non-medical: Not on file  Tobacco Use  . Smoking status: Never Smoker  . Smokeless tobacco: Never Used  Substance and Sexual Activity  . Alcohol use: Yes    Alcohol/week: 1.0 standard drinks    Types: 1 Glasses of wine per week  . Drug use: Never  . Sexual activity: Yes    Partners: Female  Lifestyle  .  Physical activity:    Days per week: Not on file    Minutes per session: Not on file  . Stress: Not on file  Relationships  . Social connections:    Talks on phone: Not on file    Gets together: Not on file    Attends religious service: Not on file    Active member of club or organization: Not on file    Attends meetings of clubs or organizations: Not on file    Relationship status: Not on file  . Intimate partner violence:    Fear of current or ex partner: Not on file    Emotionally abused: Not on file    Physically abused: Not on file    Forced sexual activity: Not on file  Other Topics Concern  . Not on file  Social History Narrative  . Not on file   Family History  Problem Relation Age of Onset  . Esophageal cancer Neg Hx   . Inflammatory bowel disease Neg Hx   . Liver disease Neg Hx   . Pancreatic cancer Neg Hx   . Stomach cancer Neg Hx   . Rectal cancer Neg Hx   There is a report of a family history of colon cancer and colon polyps which we will attempt to clarify.  I have reviewed his medical, social, and family history in detail and updated the electronic medical record as necessary.    PHYSICAL EXAMINATION  BP (!) 142/84   Pulse 75   Ht 5\' 3"  (1.6 m)   Wt 169 lb 6 oz (76.8 kg)   BMI 30.00 kg/m  Wt Readings from Last 3 Encounters:  09/11/18 169 lb 6 oz (76.8 kg)  08/18/18 168 lb (76.2 kg)  08/04/18 172 lb 9.6 oz (78.3 kg)  GEN: NAD, appears stated age, doesn't appear chronically ill PSYCH: Cooperative, without pressured speech EYE: Conjunctivae pink, sclerae anicteric ENT: MMM CV: RR without R/Gs  RESP: CTAB posteriorly, without wheezing GI: NABS, soft, NT/ND, without rebound or guarding, no HSM appreciated MSK/EXT: No lower extremity edema, no palmar erythema SKIN: No jaundice or spider angiomata NEURO:  Alert & Oriented x 3, no focal deficits, no evidence of asterixis   REVIEW OF DATA  I reviewed the following data at the time of this  encounter:  GI Procedures and Studies  August 2019 Cologuard Negative  October 2019 - No gross lesions in esophagus. - LA Grade C esophagitis. Biopsied to rule in/out Barrett's. - Z-line regular, 40 cm from the incisors. - Non-bleeding erosive gastropathy. - No gross lesions in the stomach. Biopsied. - No gross lesions in the duodenal bulb, in the first portion of the duodenum and in the second portion of the duodenum.   Laboratory Studies  Reviewed in epic  Imaging Studies  September 2019 abdominal ultrasound right upper quadrant IMPRESSION: No acute intra-abdominal pathology. Gallbladder wall cholesterolosis is noted.   ASSESSMENT  Mr. Ranganathan is a 67 y.o. male with a pmh significant for HCV-GT2 (s/p treatment) with possible  advanced fibrosis (based on elastography but incongruent based on lab studies).  The patient is seen today for evaluation and management of:  1. Liver fibrosis   2. Hepatitis C virus infection without hepatic coma, unspecified chronicity   3. Acute esophagitis    The patient is doing exceedingly well.  On his recent upper endoscopy there was evidence of esophagitis but no overt evidence of portal hypertensive gastropathy or varices.  Interestingly as discussed above his HCV fiber sure did not suggest any overt fibrosis and neither did his APRI over FIB-4 scores pre-or post.  He is only based on the elastography where he had some concern for significant fibrosis.  We reiterated and discussed the role of monitoring for a year and treating as if he has fibrosis advanced/cirrhosis.  We will continue to monitor with ultrasounds until next year when he has been one 4-year post HCV treatment.  We discussed the role that sometimes we treat patients as compensated cirrhotics and continue to monitor for [facility].  I suspect that the elastography was potentially a bit of an over call however we should be mindful and plan to repeat that at the one-year mark.  If there remains no  evidence of significant fibrosis on the elastography then we should consider this patient likely a treated HCV patient and without any evidence of significant fibrosis feel relatively comfortable that we can put him back to the general population without needing to be monitored so closely except every few years.  However if there remains an incongruence or concerned that there is significant fibrosis we will consider a MR elastography or a liver biopsy.  We discussed the risks of liver biopsy for short period in time and again feel that for this year again after his treatment of HCV we will treat him as if he is a compensated cirrhotic and monitor for [facility] and determine at the one-year mark whether we need to do a biopsy or not.  We went over the results of his ultrasound since he had not received them but I had released via my chart previously.  We will plan a follow-up endoscopy in a few months to ensure that his esophagitis has healed and subsequently decrease his PPI as we are able.  All patient questions were answered, to the best of my ability, and the patient agrees to the aforementioned plan of action with follow-up as indicated.   PLAN  1. Liver fibrosis - Plan for a 1-year U/S Elastography to see degree of fibrosis - Hold on Liver biopsy or MR-Elastography - U/S for [place] screening in 3/20 to be ordered  2. Hepatitis C virus infection without hepatic coma, unspecified chronicity - F/U with Dr. Linus Salmons as per SVR-12 month - He declines HAV/HBV Vaccination again but will consider and let us know in future  3. Acute esophagitis - Continue PPI at current dose - Transition to 20 mg if stable and based on EGD findings   No orders of the defined types were placed in this encounter.   New Prescriptions   No medications on file   Modified Medications   No medications on file    Planned Follow Up: No follow-ups on file.   Justice Britain, MD [facility]  Endoscopy Office # [phone]

## 2018-09-24 ENCOUNTER — Encounter: Payer: Self-pay | Admitting: Gastroenterology

## 2018-10-19 ENCOUNTER — Telehealth: Payer: Self-pay | Admitting: Gastroenterology

## 2018-10-19 NOTE — Telephone Encounter (Signed)
Spoke to patient. He will pick up Omeprazole 20mg  OTC and take as directed.

## 2018-12-18 ENCOUNTER — Encounter: Payer: Self-pay | Admitting: Gastroenterology

## 2018-12-21 ENCOUNTER — Telehealth: Payer: Self-pay

## 2018-12-21 NOTE — Telephone Encounter (Signed)
-----   Message from Timothy Lasso, RN sent at 09/21/2018  9:07 AM EST ----- Regarding: FW: [place] Screening U/S in 3/20   ----- Message ----- From: Irving Copas., MD Sent: 09/17/2018   8:49 AM EST To: Timothy Lasso, RN Subject: [facility] Screening U/S in 3/20                      Dear Chong Sicilian, Can you get an U/S order in place for 12/2018 for this patient Liver U/S to rule out [place]? Thanks.  Chester Holstein

## 2018-12-22 NOTE — Telephone Encounter (Signed)
The pt states that he wants to wait until September to set up any further testing and he will call back at that time.

## 2019-03-31 IMAGING — US US ABDOMEN LIMITED
1 series · 14 of 25 positions shown · non-contrast
Comparison: None.

CLINICAL DATA: Hepatitis C

EXAM:
ULTRASOUND ABDOMEN LIMITED RIGHT UPPER QUADRANT

[Series 1: us abdomen limited · 14 of 74 slices shown]
[im 1/74]
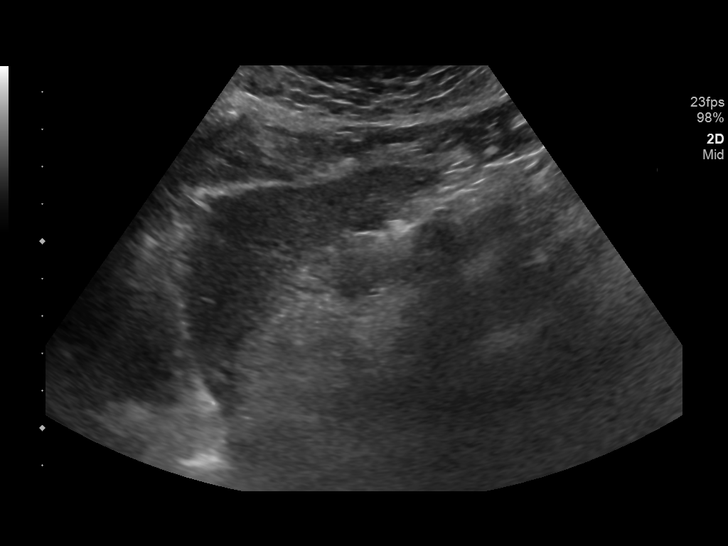
[im 7/74]
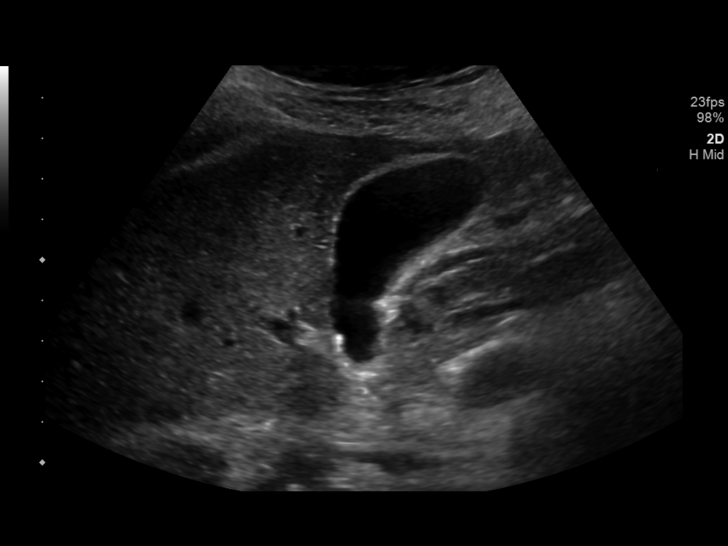
[im 13/74]
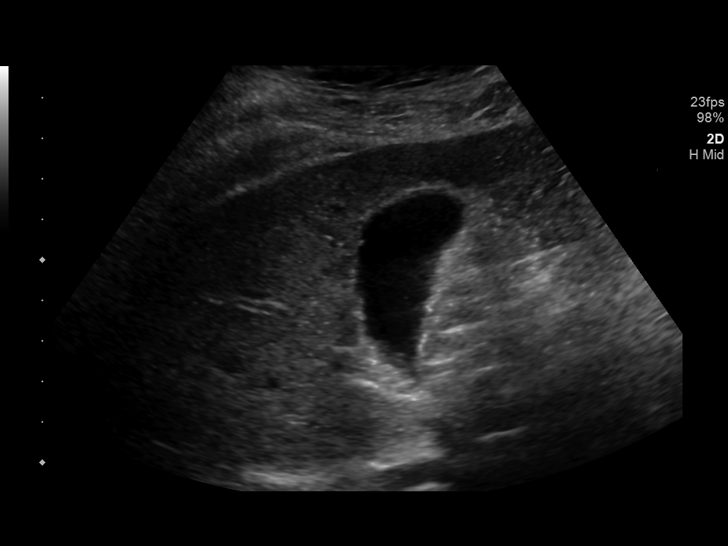
[im 19/74]
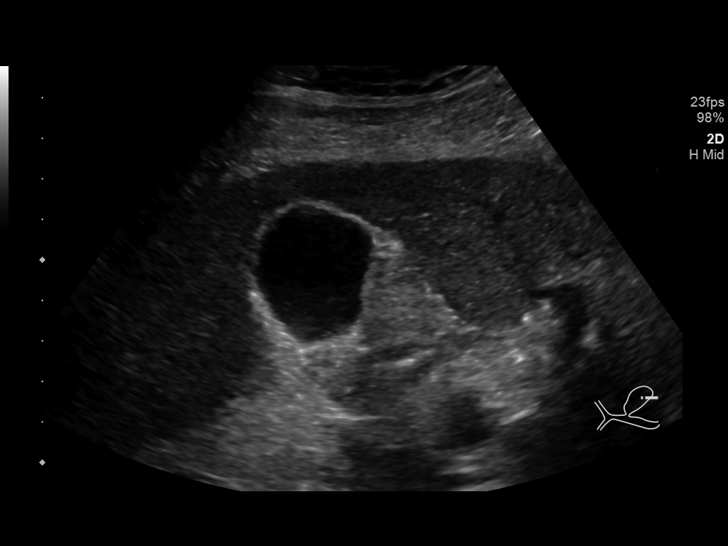
[im 25/74]
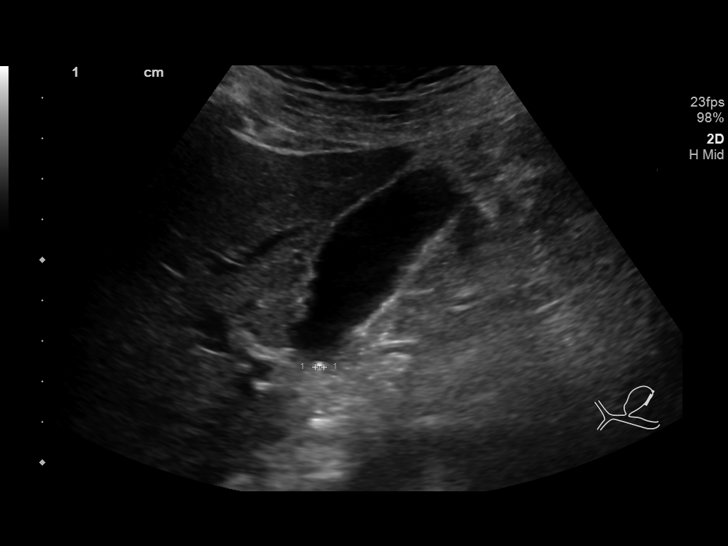
[im 28/74]
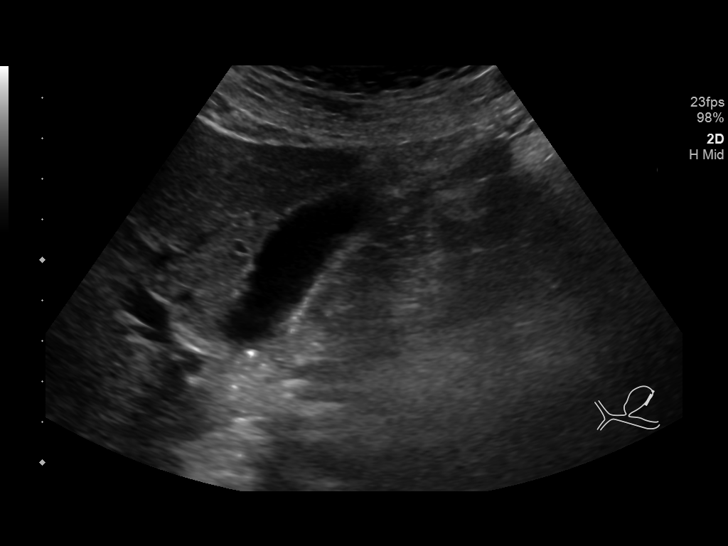
[im 34/74]
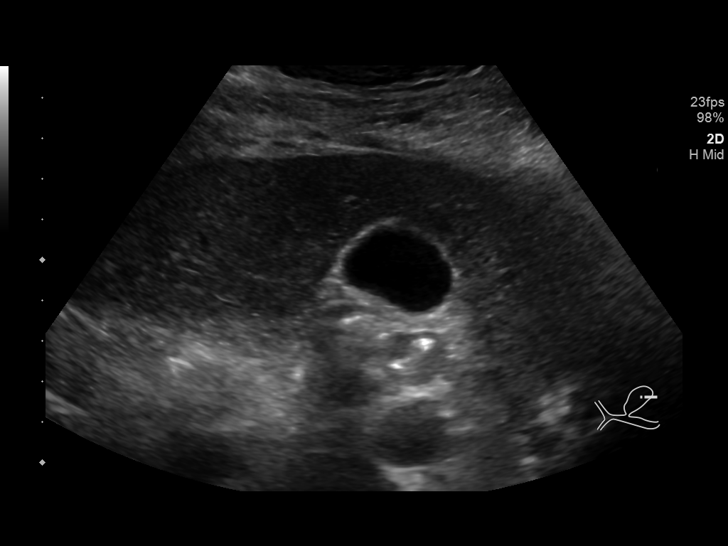
[im 40/74]
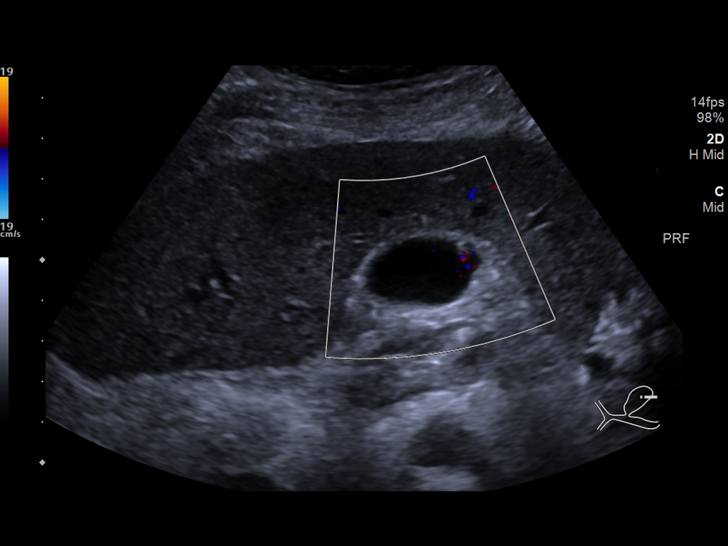
[im 46/74]
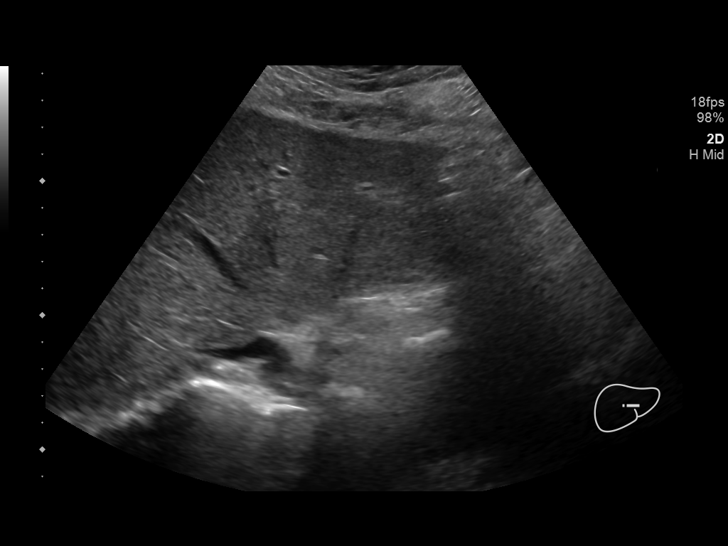
[im 49/74]
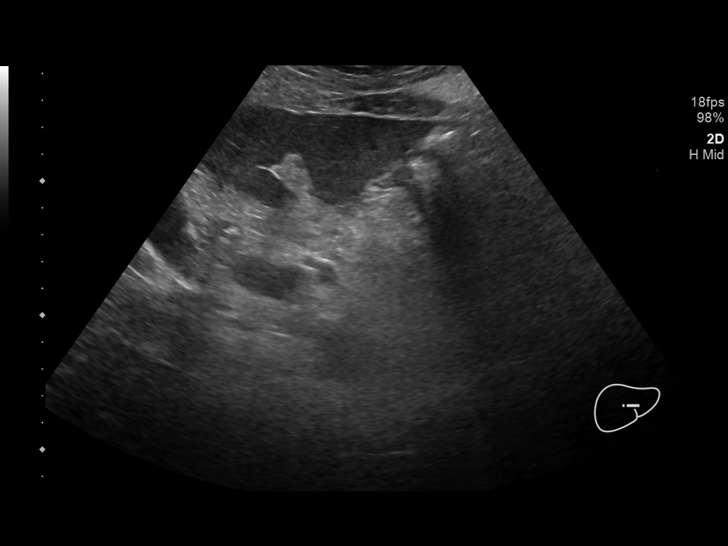
[im 55/74]
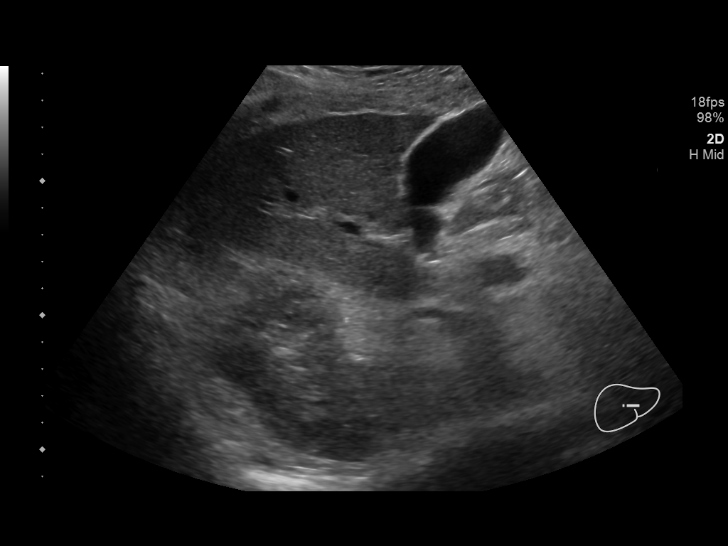
[im 61/74]
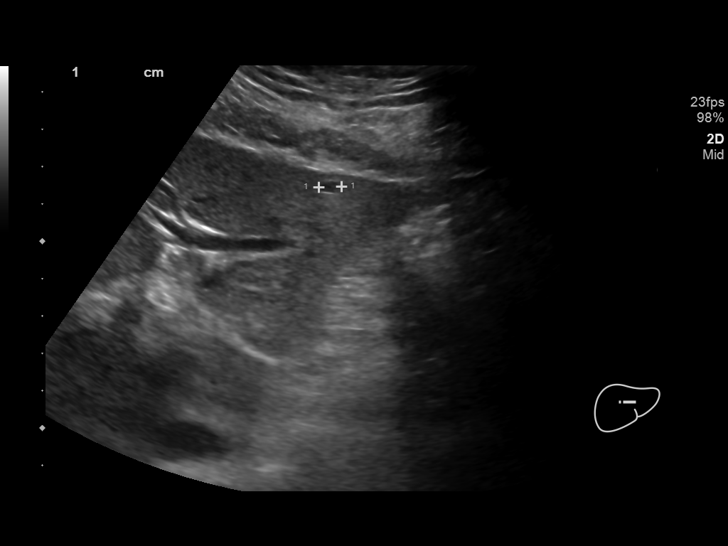
[im 67/74]
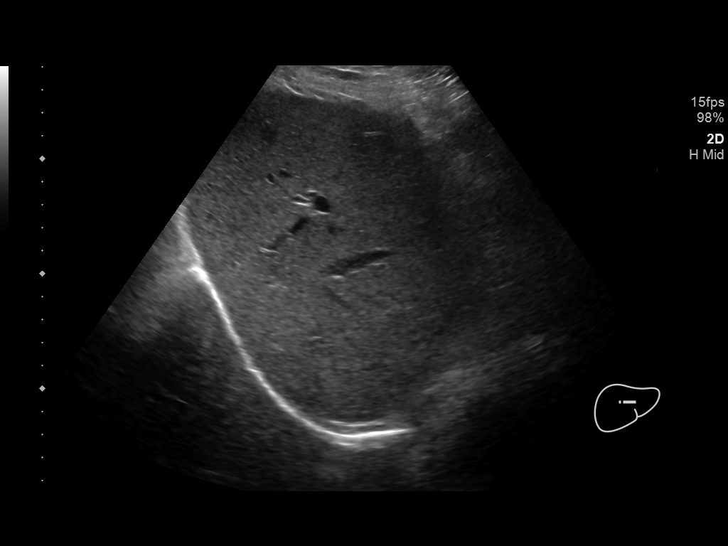
[im 74/74]
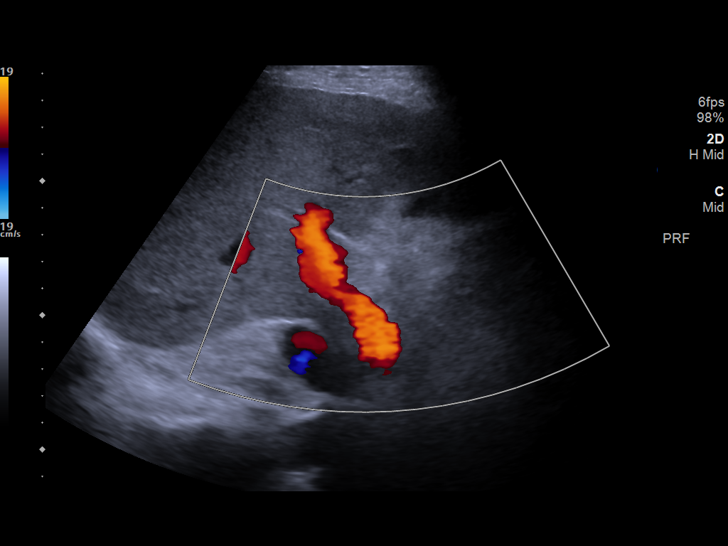

[14 of 25 positions shown; findings below may reference images not displayed]

FINDINGS: Gallbladder:

There is no gallbladder wall thickening or pericholecystic fluid. No
Murphy sign. No gallstones. Small polyps measuring 1 mm are noted.
There is also ring down artifact emanating from the wall of the
gallbladder compatible with cholesterolosis of the gallbladder wall.

Common bile duct:

Diameter: 2.5 mm

Liver:

Echogenicity is within normal limits. A small hypoechoic focus in
the left lobe measuring 0.5 cm with through transmission is
compatible with a tiny benign appearing cyst. No definite liver
masses. Portal vein is patent on color Doppler imaging with normal
direction of blood flow towards the liver.
IMPRESSION: No acute intra-abdominal pathology.

Gallbladder wall cholesterolosis is noted.

## 2019-04-20 IMAGING — US US ABDOMEN COMPLETE W/ ELASTOGRAPHY
1 series · 13 of 13 positions shown · non-contrast
Comparison: None.

CLINICAL DATA: Chronic hepatitis-C without hepatic coma.



[Series 1: us abdomen complete w/ elastography · 0.19mm/px · 13 of 13 slices shown]
[im 1/13]
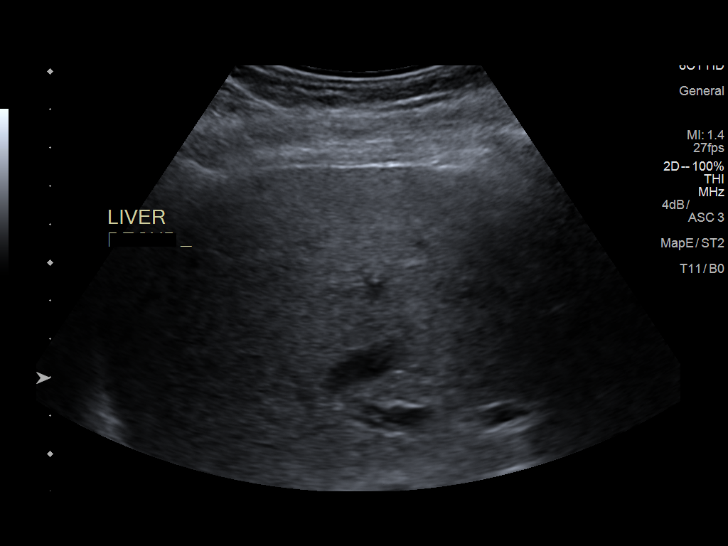
[im 2/13]
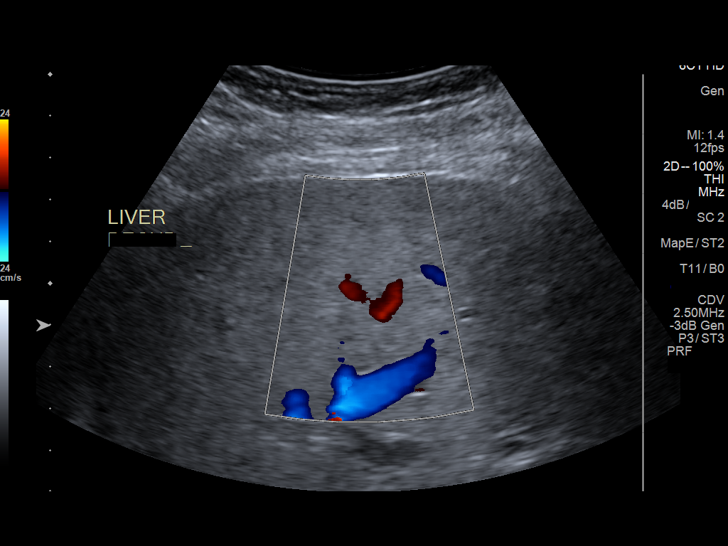
[im 3/13]
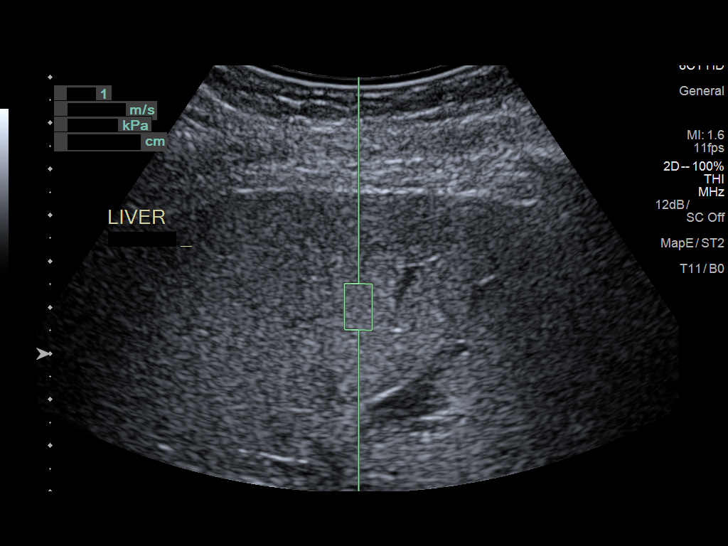
[im 4/13]
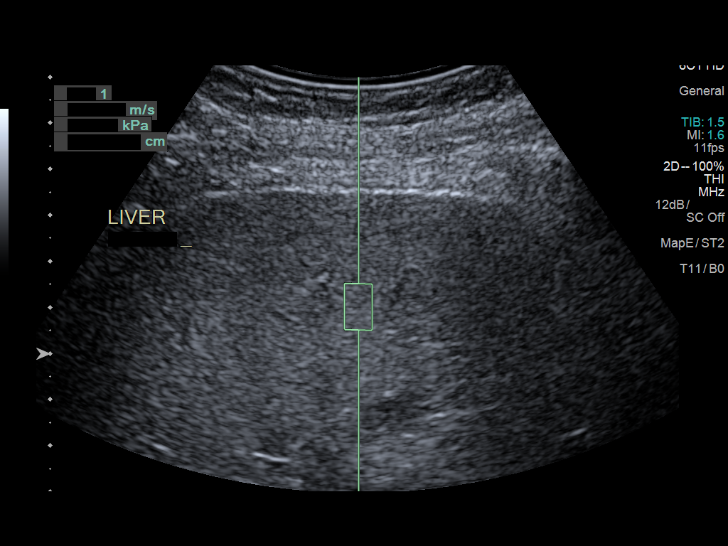
[im 5/13]
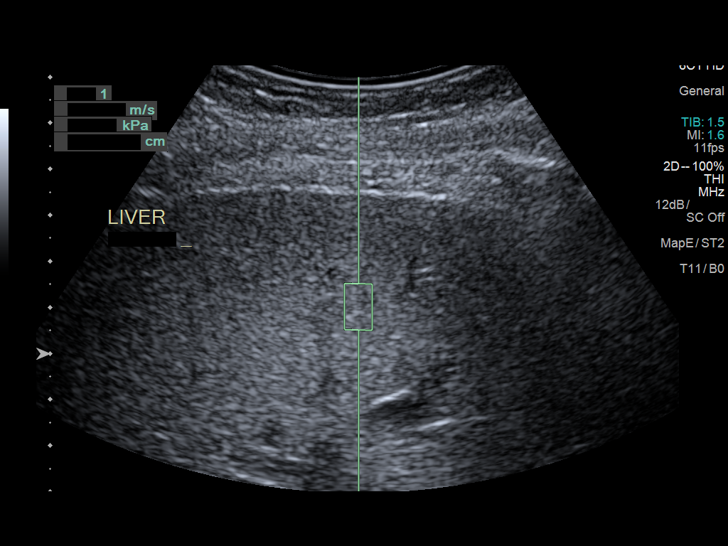
[im 6/13]
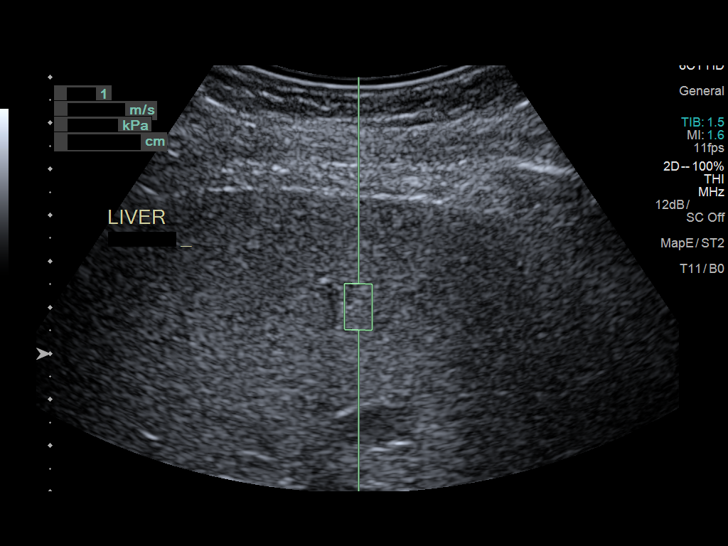
[im 7/13]
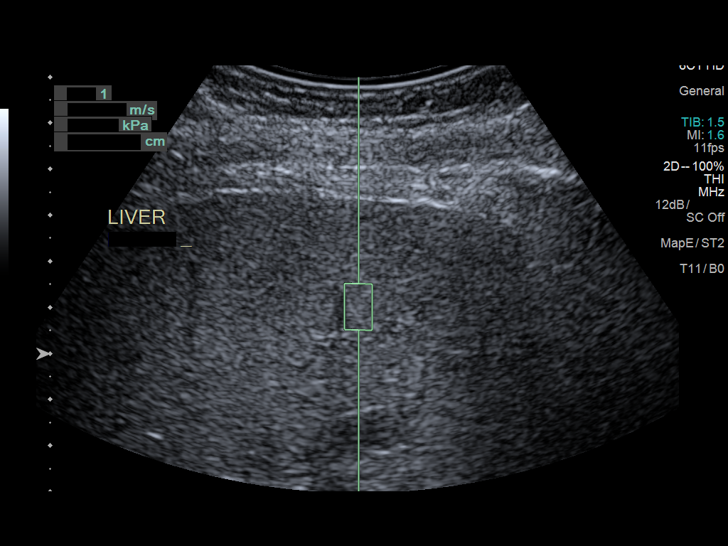
[im 8/13]
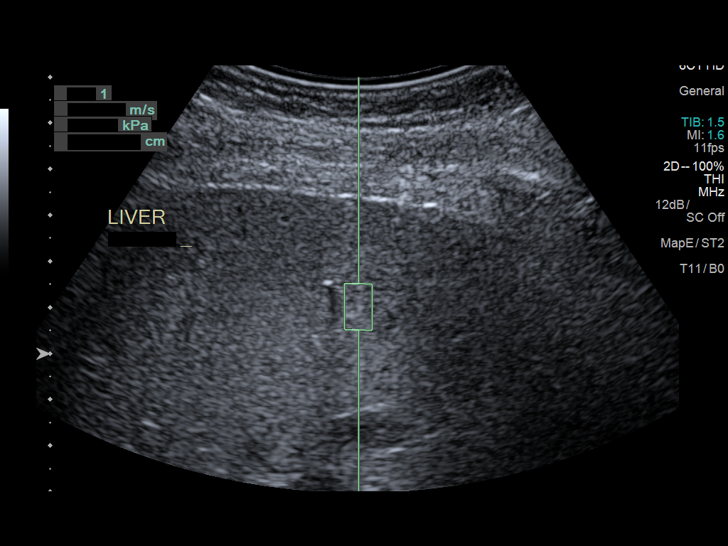
[im 9/13]
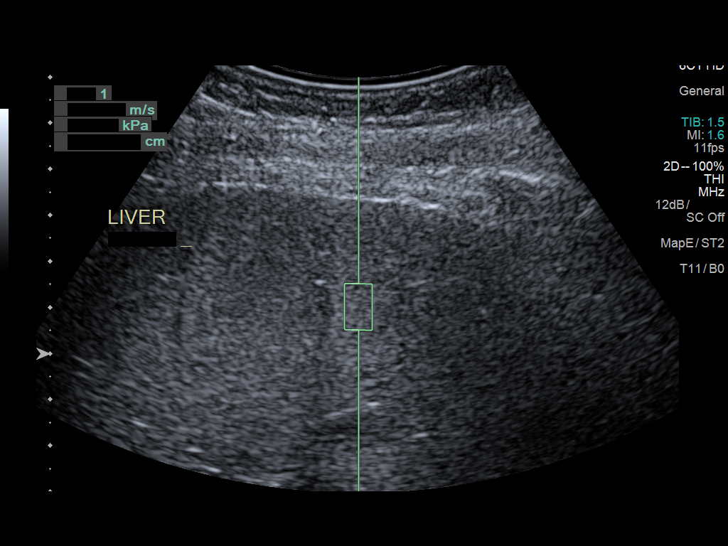
[im 10/13]
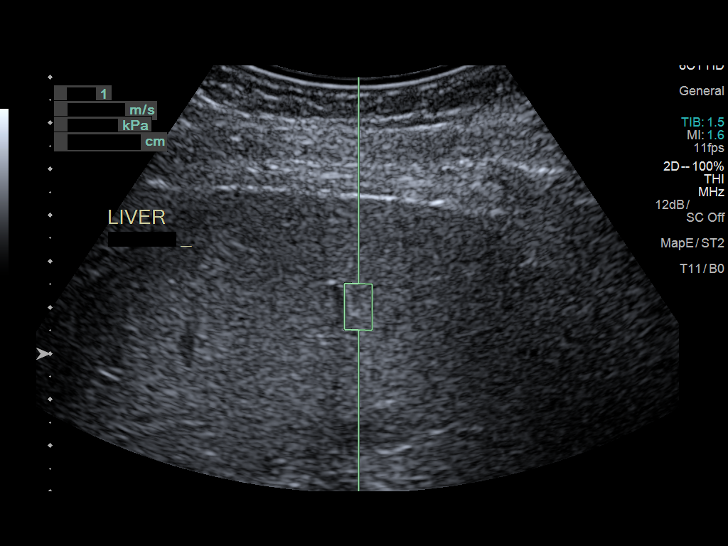
[im 11/13]
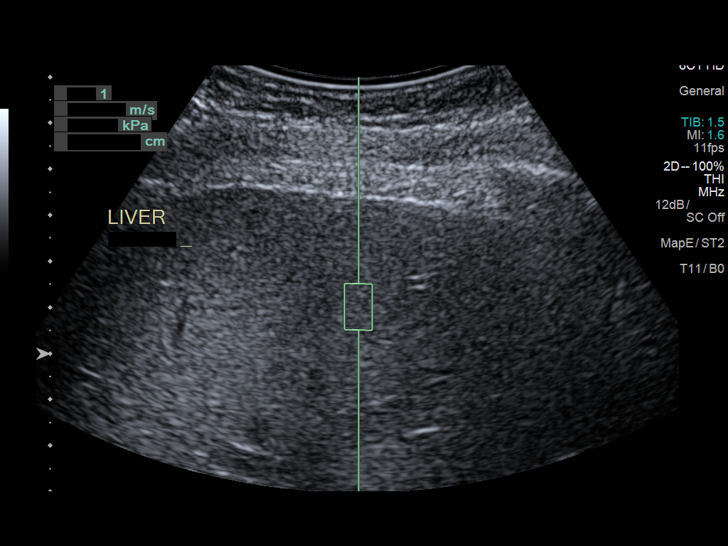
[im 12/13]
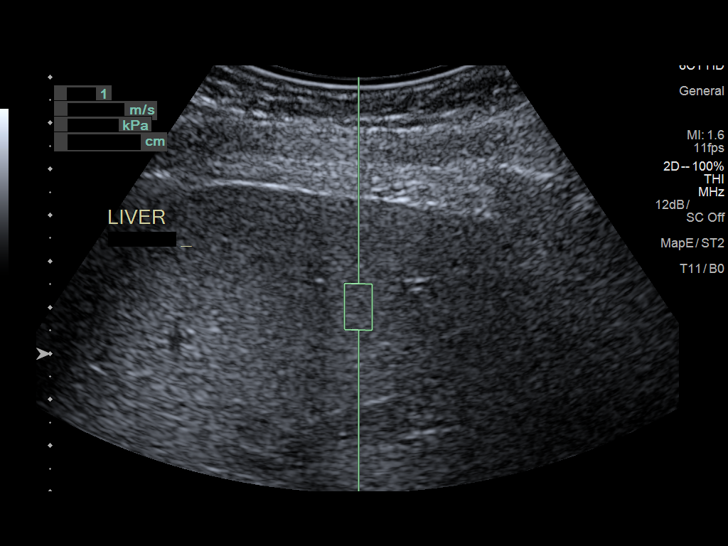
[im 13/13]
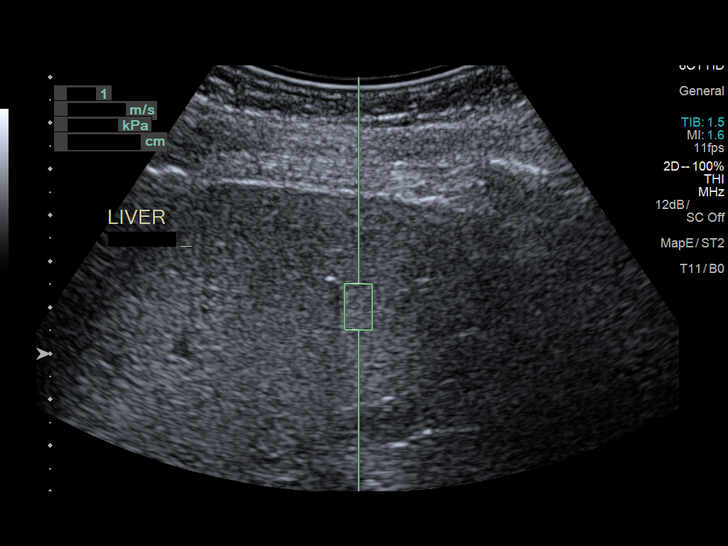

[13 of 13 positions shown; findings below may reference images not displayed]

FINDINGS: ULTRASOUND ABDOMEN

Gallbladder: No gallstones or wall thickening visualized. Several
foci of ring-down artifact are seen from the gallbladder wall,
consistent with benign adenomyomatosis. No sonographic Murphy sign
noted by sonographer.

Common bile duct: Diameter: 4 mm, within normal limits.

Liver: Tiny sub-cm cyst noted in left hepatic lobe. No liver masses
identified. Within normal limits in parenchymal echogenicity. Portal
vein is patent on color Doppler imaging with normal direction of
blood flow towards the liver.

IVC: No abnormality visualized.

Pancreas: Visualized portion unremarkable.

Spleen: Size and appearance within normal limits.

Right Kidney: Length: 11.2 cm. Echogenicity within normal limits. No
mass or hydronephrosis visualized.

Left Kidney: Length: 12.2 cm. Echogenicity within normal limits. No
mass or hydronephrosis visualized.

Abdominal aorta: No aneurysm visualized.

Other findings: None.

ULTRASOUND HEPATIC ELASTOGRAPHY

Device: Siemens Helix VTQ

Patient position: Left Lateral Decubitus

Transducer [id number]

Number of measurements: 10

Hepatic segment:  8

Median velocity:   3.36  m/sec

IQR:

IQR/Median velocity ratio:

Corresponding Metavir fibrosis score:  Some F3 + F4

Risk of fibrosis: High

Limitations of exam: None

Pertinent findings noted on other imaging exams:  None

Please note that abnormal shear wave velocities may also be
identified in clinical settings other than with hepatic fibrosis,
such as: acute hepatitis, elevated right heart and central venous
pressures including use of beta blockers, Los disease
([place]), infiltrative processes such as
mastocytosis/amyloidosis/infiltrative tumor, extrahepatic
cholestasis, in the post-prandial state, and liver transplantation.
Correlation with patient history, laboratory data, and clinical
condition recommended.
IMPRESSION: ULTRASOUND ABDOMEN:
No evidence of gallstones or biliary ductal dilatation. Gallbladder
adenomyomatosis incidentally noted.

Tiny sub-cm left hepatic lobe cyst.  No liver mass identified.

ULTRASOUND HEPATIC ELASTOGRAPHY:

Median hepatic shear wave velocity is calculated at 3.36 m/sec.

Corresponding Metavir fibrosis score is  Some F3 + F4.

Risk of fibrosis is High.

Follow-up: Follow up advised

## 2019-07-02 ENCOUNTER — Telehealth: Payer: Self-pay | Admitting: Gastroenterology

## 2019-07-02 DIAGNOSIS — B1920 Unspecified viral hepatitis C without hepatic coma: Secondary | ICD-10-CM

## 2019-07-02 DIAGNOSIS — K7400 Hepatic fibrosis, unspecified: Secondary | ICD-10-CM

## 2019-07-02 NOTE — Telephone Encounter (Signed)
You have been scheduled for an abdominal ultrasound at [facility] (1st floor of hospital) on 07/26/19 at 11 am. Please arrive 15 minutes prior to your appointment for registration. Make certain not to have anything to eat or drink 6 hours prior to your appointment. Should you need to reschedule your appointment, please contact radiology at [phone]. This test typically takes about 30 minutes to perform.  The patient has been notified of this information and all questions answered. The pt has been advised of the information and verbalized understanding.

## 2019-07-26 ENCOUNTER — Encounter: Payer: Self-pay | Admitting: Physician Assistant

## 2019-07-26 ENCOUNTER — Ambulatory Visit (HOSPITAL_COMMUNITY): Payer: Medicare Other

## 2019-07-26 ENCOUNTER — Ambulatory Visit (INDEPENDENT_AMBULATORY_CARE_PROVIDER_SITE_OTHER): Payer: Medicare Other | Admitting: Physician Assistant

## 2019-07-26 ENCOUNTER — Other Ambulatory Visit: Payer: Self-pay

## 2019-07-26 VITALS — BP 120/70 | HR 51 | Temp 97.8°F | Resp 14 | Ht 63.0 in | Wt 169.0 lb

## 2019-07-26 DIAGNOSIS — Z0000 Encounter for general adult medical examination without abnormal findings: Secondary | ICD-10-CM

## 2019-07-26 DIAGNOSIS — K7400 Hepatic fibrosis, unspecified: Secondary | ICD-10-CM | POA: Diagnosis not present

## 2019-07-26 DIAGNOSIS — Z125 Encounter for screening for malignant neoplasm of prostate: Secondary | ICD-10-CM

## 2019-07-26 DIAGNOSIS — Z23 Encounter for immunization: Secondary | ICD-10-CM | POA: Diagnosis not present

## 2019-07-26 LAB — LIPID PANEL
Cholesterol: 204 mg/dL — ABNORMAL HIGH (ref 0–200)
HDL: 70.4 mg/dL (ref >39.00)
LDL Cholesterol: 117 mg/dL — ABNORMAL HIGH (ref 0–99)
NonHDL: 133.91
Total CHOL/HDL Ratio: 3
Triglycerides: 84 mg/dL (ref 0.0–149.0)
VLDL: 16.8 mg/dL (ref 0.0–40.0)

## 2019-07-26 LAB — COMPREHENSIVE METABOLIC PANEL
ALT: 14 U/L (ref 0–53)
AST: 19 U/L (ref 0–37)
Albumin: 4.6 g/dL (ref 3.5–5.2)
Alkaline Phosphatase: 50 U/L (ref 39–117)
BUN: 13 mg/dL (ref 6–23)
CO2: 28 mEq/L (ref 19–32)
Calcium: 10.1 mg/dL (ref 8.4–10.5)
Chloride: 103 mEq/L (ref 96–112)
Creatinine, Ser: 0.7 mg/dL (ref 0.40–1.50)
GFR: 112.32 mL/min (ref >60.00)
Glucose, Bld: 109 mg/dL — ABNORMAL HIGH (ref 70–99)
Potassium: 4.5 mEq/L (ref 3.5–5.1)
Sodium: 139 mEq/L (ref 135–145)
Total Bilirubin: 0.8 mg/dL (ref 0.2–1.2)
Total Protein: 7.3 g/dL (ref 6.0–8.3)

## 2019-07-26 LAB — PSA, MEDICARE: PSA: 2.14 ng/ml (ref 0.10–4.00)

## 2019-07-26 NOTE — Progress Notes (Signed)
Subjective:   Brandon Bernard is a 68 y.o. male who presents for Medicare Annual/Subsequent preventive examination.  Patient following with GI for history of liver cirrhosis secondary to [place] Hep C. Has [place] scheduled for tomorrow. No current medications and has done very well.  Review of Systems:  Review of Systems  Constitutional: Negative for fever and weight loss.  HENT: Negative for ear discharge, ear pain, hearing loss and tinnitus.   Eyes: Negative for blurred vision, double vision, photophobia and pain.  Respiratory: Negative for cough and shortness of breath.   Cardiovascular: Negative for chest pain and palpitations.  Gastrointestinal: Negative for abdominal pain, blood in stool, constipation, diarrhea, heartburn, melena, nausea and vomiting.  Genitourinary: Negative for dysuria, flank pain, frequency, hematuria and urgency.       Nocturia x 1-2  Musculoskeletal: Negative for falls.  Neurological: Negative for dizziness, loss of consciousness and headaches.  Endo/Heme/Allergies: Negative for environmental allergies.  Psychiatric/Behavioral: Negative for depression, hallucinations, substance abuse and suicidal ideas. The patient is not nervous/anxious and does not have insomnia.    Objective:    Vitals: BP 120/70   Pulse (!) 51   Temp 97.8 F (36.6 C) (Temporal)   Resp 14   Ht 5\' 3"  (1.6 m)   Wt 169 lb (76.7 kg)   SpO2 99%   BMI 29.94 kg/m   Body mass index is 29.94 kg/m.  Advanced Directives 07/26/2019 07/22/2018  Does Patient Have a Medical Advance Directive? Yes Yes  Type of Paramedic of [place];Living will [place];Living will  Copy of [place] in Chart? No - copy requested No - copy requested   Tobacco Social History   Tobacco Use  Smoking Status Never Smoker  Smokeless Tobacco Never Used     Counseling given: Yes   Clinical Intake:  Pre-visit preparation completed: No  Pain : No/denies  pain     BMI - recorded: 29.94 Nutritional Status: BMI 25 -29 Overweight Diabetes: No  How often do you need to have someone help you when you read instructions, pamphlets, or other written materials from your doctor or pharmacy?: 1 - Never  Interpreter Needed?: No     Past Medical History:  Diagnosis Date  . Hepatitis C 2019   treated in Spring 2019   . History of chickenpox   . Seasonal allergies   . Wears glasses    Past Surgical History:  Procedure Laterality Date  . EYE SURGERY Left 1996   Orbital Reconstruction  . FRACTURE SURGERY  2014   Left ankle, Right knee, Right wrist, larynx, Lumbar vertebrae, cervical vertebra  . HERNIA REPAIR  1994  . TONSILLECTOMY    . wisdom teeth ext     Family History  Problem Relation Age of Onset  . Esophageal cancer Neg Hx   . Inflammatory bowel disease Neg Hx   . Liver disease Neg Hx   . Pancreatic cancer Neg Hx   . Stomach cancer Neg Hx   . Rectal cancer Neg Hx   . Colon cancer Neg Hx   . Colon polyps Neg Hx    Social History   Socioeconomic History  . Marital status: Married    Spouse name: Not on file  . Number of children: Not on file  . Years of education: Not on file  . Highest education level: Not on file  Occupational History  . Not on file  Social Needs  . Financial resource strain: Not  on file  . Food insecurity    Worry: Not on file    Inability: Not on file  . Transportation needs    Medical: Not on file    Non-medical: Not on file  Tobacco Use  . Smoking status: Never Smoker  . Smokeless tobacco: Never Used  Substance and Sexual Activity  . Alcohol use: Yes    Alcohol/week: 1.0 standard drinks    Types: 1 Glasses of wine per week  . Drug use: Never  . Sexual activity: Yes    Partners: Female  Lifestyle  . Physical activity    Days per week: Not on file    Minutes per session: Not on file  . Stress: Not on file  Relationships  . Social Herbalist on phone: Not on file    Gets  together: Not on file    Attends religious service: Not on file    Active member of club or organization: Not on file    Attends meetings of clubs or organizations: Not on file    Relationship status: Not on file  Other Topics Concern  . Not on file  Social History Narrative  . Not on file    Outpatient Encounter Medications as of 07/26/2019  Medication Sig  . Multiple Vitamin (MULTIVITAMIN) tablet Take 1 tablet by mouth daily.  . [DISCONTINUED] omeprazole (PRILOSEC) 40 MG capsule Take 1 capsule (40 mg total) by mouth daily.   No facility-administered encounter medications on file as of 07/26/2019.     Activities of Daily Living In your present state of health, do you have any difficulty performing the following activities: 07/26/2019  Hearing? N  Vision? N  Difficulty concentrating or making decisions? N  Walking or climbing stairs? N  Dressing or bathing? N  Doing errands, shopping? N  Preparing Food and eating ? N  Using the Toilet? N  In the past six months, have you accidently leaked urine? N  Do you have problems with loss of bowel control? N  Managing your Medications? N  Managing your Finances? N  Housekeeping or managing your Housekeeping? N  Some recent data might be hidden   Patient Care Team: Delorse Limber as PCP - General (Family Medicine) Mansouraty, Telford Nab., MD as Consulting Physician (Gastroenterology) Allyn Kenner, MD (Dermatology) Comer, Okey Regal, MD as Consulting Physician (Infectious Diseases) Henreitta Leber (Dentistry)   Assessment:   This is a routine wellness examination for Brandon Bernard.  Exercise Activities and Dietary recommendations Current Exercise Habits: Home exercise routine, Type of exercise: walking;stretching, Time (Minutes): 30, Frequency (Times/Week): 3, Weekly Exercise (Minutes/Week): 90, Intensity: Mild  Goals    . Patient Stated     Maintain current health.        Fall Risk Fall Risk  07/26/2019 07/22/2018 10/30/2017  09/25/2017  Falls in the past year? 0 No No No  Number falls in past yr: 0 - - -  Injury with Fall? 0 - - -  Follow up Falls evaluation completed - - -   Is the patient's home free of loose throw rugs in walkways, pet beds, electrical cords, etc?   yes      Grab bars in the bathroom? no      Handrails on the stairs?   yes      Adequate lighting?   yes  Depression Screen PHQ 2/9 Scores 07/26/2019 07/22/2018 10/30/2017 09/25/2017  PHQ - 2 Score 0 0 0 0    Cognitive Function MMSE -  Mini Mental State Exam 07/22/2018  Orientation to time 5  Orientation to Place 5  Registration 3  Attention/ Calculation 5  Recall 3  Language- name 2 objects 2  Language- repeat 1  Language- follow 3 step command 3  Language- read & follow direction 1  Write a sentence 1  Copy design 1  Total score 30   Immunization History  Administered Date(s) Administered  . Pneumococcal Polysaccharide-23 10/30/2017  . Tdap 10/22/2011   Qualifies for Shingles Vaccine? Y -- Discussed with patient. Has interest in Shingrix.   Screening Tests Health Maintenance  Topic Date Due  . PNA vac Low Risk Adult (2 of 2 - PCV13) 10/30/2018  . INFLUENZA VACCINE  05/22/2019  . Fecal DNA (Cologuard)  06/18/2021  . DTaP/Tdap/Td (2 - Td) 10/21/2021  . TETANUS/TDAP  10/21/2021  . Hepatitis C Screening  Completed   Cancer Screenings: Lung: Low Dose CT Chest recommended if Age 61-80 years, 30 pack-year currently smoking OR have quit w/in 15years. Patient does not qualify. Colorectal: UTD  Plan:  1. Encounter for Medicare annual wellness exam During the course of the visit the patient was educated and counseled about appropriate screening and preventive services including: Fall prevention, Bone densitometry screening, Diabetes screening, Nutrition counseling. Colon cancer screening UTD. Patient defers Flu shot until later in season. Prevnar updated today. He is to get Shingrix from pharmacy. Will obtain fasting labs at today's  visit. MMSE updated 30/30.    Patient Instructions (the written plan) was given to the patient.     I have personally reviewed and noted the following in the patient's chart:   . Medical and social history . Use of alcohol, tobacco or illicit drugs  . Current medications and supplements . Functional ability and status . Nutritional status . Physical activity . Advanced directives . List of other physicians . Hospitalizations, surgeries, and ER visits in previous 12 months . Vitals . Screenings to include cognitive, depression, and falls . Referrals and appointments  In addition, I have reviewed and discussed with patient certain preventive protocols, quality metrics, and best practice recommendations. A written personalized care plan for preventive services as well as general preventive health recommendations were provided to patient.     Leeanne Rio, PA-C  07/26/2019

## 2019-07-26 NOTE — Patient Instructions (Signed)
Please go to the lab today for blood work.  I will call you with your results. We will alter treatment regimen(s) if indicated by your results.    Preventive Care 65 Years and Older, Male Preventive care refers to lifestyle choices and visits with your health care provider that can promote health and wellness. This includes:  A yearly physical exam. This is also called an annual well check.  Regular dental and eye exams.  Immunizations.  Screening for certain conditions.  Healthy lifestyle choices, such as diet and exercise. What can I expect for my preventive care visit? Physical exam Your health care provider will check:  Height and weight. These may be used to calculate body mass index (BMI), which is a measurement that tells if you are at a healthy weight.  Heart rate and blood pressure.  Your skin for abnormal spots. Counseling Your health care provider may ask you questions about:  Alcohol, tobacco, and drug use.  Emotional well-being.  Home and relationship well-being.  Sexual activity.  Eating habits.  History of falls.  Memory and ability to understand (cognition).  Work and work environment. What immunizations do I need?  Influenza (flu) vaccine  This is recommended every year. Tetanus, diphtheria, and pertussis (Tdap) vaccine  You may need a Td booster every 10 years. Varicella (chickenpox) vaccine  You may need this vaccine if you have not already been vaccinated. Zoster (shingles) vaccine  You may need this after age 60. Pneumococcal conjugate (PCV13) vaccine  One dose is recommended after age 67. Pneumococcal polysaccharide (PPSV23) vaccine  One dose is recommended after age 67. Measles, mumps, and rubella (MMR) vaccine  You may need at least one dose of MMR if you were born in 1957 or later. You may also need a second dose. Meningococcal conjugate (MenACWY) vaccine  You may need this if you have certain conditions. Hepatitis A  vaccine  You may need this if you have certain conditions or if you travel or work in places where you may be exposed to hepatitis A. Hepatitis B vaccine  You may need this if you have certain conditions or if you travel or work in places where you may be exposed to hepatitis B. Haemophilus influenzae type b (Hib) vaccine  You may need this if you have certain conditions. You may receive vaccines as individual doses or as more than one vaccine together in one shot (combination vaccines). Talk with your health care provider about the risks and benefits of combination vaccines. What tests do I need? Blood tests  Lipid and cholesterol levels. These may be checked every 5 years, or more frequently depending on your overall health.  Hepatitis C test.  Hepatitis B test. Screening  Lung cancer screening. You may have this screening every year starting at age 55 if you have a 30-pack-year history of smoking and currently smoke or have quit within the past 15 years.  Colorectal cancer screening. All adults should have this screening starting at age 50 and continuing until age 75. Your health care provider may recommend screening at age 45 if you are at increased risk. You will have tests every 1-10 years, depending on your results and the type of screening test.  Prostate cancer screening. Recommendations will vary depending on your family history and other risks.  Diabetes screening. This is done by checking your blood sugar (glucose) after you have not eaten for a while (fasting). You may have this done every 1-3 years.  Abdominal aortic aneurysm (  AAA) screening. You may need this if you are a current or former smoker.  Sexually transmitted disease (STD) testing. Follow these instructions at home: Eating and drinking  Eat a diet that includes fresh fruits and vegetables, whole grains, lean protein, and low-fat dairy products. Limit your intake of foods with high amounts of sugar, saturated  fats, and salt.  Take vitamin and mineral supplements as recommended by your health care provider.  Do not drink alcohol if your health care provider tells you not to drink.  If you drink alcohol: ? Limit how much you have to 0-2 drinks a day. ? Be aware of how much alcohol is in your drink. In the [place] one drink equals one 12 oz bottle of beer (355 mL), one 5 oz glass of wine (148 mL), or one 1 oz glass of hard liquor (44 mL). Lifestyle  Take daily care of your teeth and gums.  Stay active. Exercise for at least 30 minutes on 5 or more days each week.  Do not use any products that contain nicotine or tobacco, such as cigarettes, e-cigarettes, and chewing tobacco. If you need help quitting, ask your health care provider.  If you are sexually active, practice safe sex. Use a condom or other form of protection to prevent STIs (sexually transmitted infections).  Talk with your health care provider about taking a low-dose aspirin or statin. What's next?  Visit your health care provider once a year for a well check visit.  Ask your health care provider how often you should have your eyes and teeth checked.  Stay up to date on all vaccines. This information is not intended to replace advice given to you by your health care provider. Make sure you discuss any questions you have with your health care provider. Document Released: 11/03/2015 Document Revised: 10/01/2018 Document Reviewed: 10/01/2018 Elsevier Patient Education  2020 [employer].

## 2019-07-27 ENCOUNTER — Ambulatory Visit (HOSPITAL_COMMUNITY)
Admission: RE | Admit: 2019-07-27 | Discharge: 2019-07-27 | Disposition: A | Discharge status: Home / Self Care | Payer: Medicare Other | Source: Ambulatory Visit | Attending: Gastroenterology | Admitting: Gastroenterology

## 2019-07-27 ENCOUNTER — Other Ambulatory Visit (INDEPENDENT_AMBULATORY_CARE_PROVIDER_SITE_OTHER): Payer: Medicare Other

## 2019-07-27 ENCOUNTER — Other Ambulatory Visit: Payer: Self-pay

## 2019-07-27 DIAGNOSIS — K7400 Hepatic fibrosis, unspecified: Secondary | ICD-10-CM | POA: Insufficient documentation

## 2019-07-27 DIAGNOSIS — R7309 Other abnormal glucose: Secondary | ICD-10-CM

## 2019-07-27 DIAGNOSIS — K7460 Unspecified cirrhosis of liver: Secondary | ICD-10-CM | POA: Diagnosis not present

## 2019-07-27 DIAGNOSIS — B1920 Unspecified viral hepatitis C without hepatic coma: Secondary | ICD-10-CM | POA: Insufficient documentation

## 2019-07-27 LAB — HEMOGLOBIN A1C: Hgb A1c MFr Bld: 5.6 % (ref 4.6–6.5)

## 2019-08-02 ENCOUNTER — Telehealth: Payer: Self-pay

## 2019-08-02 NOTE — Telephone Encounter (Signed)
-----   Message from Irving Copas., MD sent at 07/31/2019  5:29 AM EDT ----- Regarding: Follow-up Brandon Bernard,I have released the patient's results of his ultrasound on my chart. He needs a 36-month liver ultrasound with elastography ordered.We had wanted to repeat an EGD a few months after his last ensure that his esophagitis is healed and that he has no underlying Barrett's that would require further surveillance.I would like to move forward with that in the next few months if he is okay with that.If he would like to see [place] in clinic then he can schedule follow-up with me in the coming weeks.Thanks.GM

## 2019-08-02 NOTE — Telephone Encounter (Signed)
Recall for EGD and [place] have been entered into Epic.  I will contact the pt in 6 months for [place].  I will contact the pt prior and if office visit is requested we can set that up.

## 2019-08-24 DIAGNOSIS — C44519 Basal cell carcinoma of skin of other part of trunk: Secondary | ICD-10-CM | POA: Diagnosis not present

## 2019-08-24 DIAGNOSIS — D225 Melanocytic nevi of trunk: Secondary | ICD-10-CM | POA: Diagnosis not present

## 2019-08-24 DIAGNOSIS — C44619 Basal cell carcinoma of skin of left upper limb, including shoulder: Secondary | ICD-10-CM | POA: Diagnosis not present

## 2019-08-24 DIAGNOSIS — L821 Other seborrheic keratosis: Secondary | ICD-10-CM | POA: Diagnosis not present

## 2020-01-31 ENCOUNTER — Telehealth: Payer: Self-pay

## 2020-01-31 NOTE — Telephone Encounter (Signed)
-----   Message from Timothy Lasso, RN sent at 08/02/2019 12:42 PM EDT -----  14-month liver ultrasound with elastography

## 2020-01-31 NOTE — Telephone Encounter (Signed)
I called the pt and tried to set up the liver Ultrasound with elastography and he declines due to finances.  He says he will call when he is ready to schedule.

## 2020-02-01 NOTE — Telephone Encounter (Signed)
Patty, Thanks for letting me know. If the cost differential between the ultrasound with elastography versus a normal right upper quadrant ultrasound is something that he can before then I am fine with just the right upper quadrant ultrasound to evaluate for hepatoma/HCC. So that we do not lose the patient for an extended period in time, I would bring him in for a recall for a clinic in approximately 2 months. Please let him know the reasoning again as to why we do the liver ultrasound for hepatoma screening. At the end of the day, it is his decision and as long as he is properly aware of the risks of not doing [place] screening we had to go based on patient desires in decisions. Thank you. GM

## 2020-02-01 NOTE — Telephone Encounter (Signed)
I spoke at length with the pt and he is adamant that he does not wish to have any testing or appointments at this time. He says "I will call when I am ready".

## 2020-02-02 NOTE — Telephone Encounter (Signed)
Thank you for doing this. GM

## 2020-07-06 ENCOUNTER — Telehealth: Payer: Self-pay | Admitting: Gastroenterology

## 2020-07-06 ENCOUNTER — Telehealth: Payer: Self-pay | Admitting: Physician Assistant

## 2020-07-06 DIAGNOSIS — Z1283 Encounter for screening for malignant neoplasm of skin: Secondary | ICD-10-CM

## 2020-07-06 DIAGNOSIS — K7400 Hepatic fibrosis, unspecified: Secondary | ICD-10-CM

## 2020-07-06 DIAGNOSIS — B1920 Unspecified viral hepatitis C without hepatic coma: Secondary | ICD-10-CM

## 2020-07-06 NOTE — Telephone Encounter (Signed)
Left message on machine to call back needs RUQ ultrasound for hepatoma screening.

## 2020-07-06 NOTE — Telephone Encounter (Signed)
Referral placed to Dr Nevada Crane office. Patient has previously been a patient with this Dermatologist

## 2020-07-06 NOTE — Telephone Encounter (Signed)
Patient would like a referral to a dermatologist in the [facility] - He currently sees [employer]. And would like to have a dermatologist that is younger and in the [facility] network  It is alright to leave a message on his answering machine.

## 2020-07-06 NOTE — Telephone Encounter (Signed)
Patient trying to contact to sch ultrasound.

## 2020-07-07 NOTE — Telephone Encounter (Signed)
The patient has been notified of this information and all questions answered.

## 2020-07-07 NOTE — Telephone Encounter (Signed)
You have been scheduled for an abdominal ultrasound at [facility] (1st floor of hospital) on 07/18/20 at 9 am. Please arrive 15 minutes prior to your appointment for registration. Make certain not to have anything to eat or drink 6 hours prior to your appointment. Should you need to reschedule your appointment, please contact radiology at [phone]. This test typically takes about 30 minutes to perform.  Left message on machine to call back

## 2020-07-11 DIAGNOSIS — Z23 Encounter for immunization: Secondary | ICD-10-CM | POA: Diagnosis not present

## 2020-07-17 ENCOUNTER — Telehealth: Payer: Self-pay | Admitting: Physician Assistant

## 2020-07-17 NOTE — Telephone Encounter (Signed)
Has questions about a referral to a dermatologist.

## 2020-07-17 NOTE — Telephone Encounter (Signed)
Patient called back and said that it would be fine to send him to a doctor anytime next year.  He states this is just for his yearly cancer screening

## 2020-07-17 NOTE — Telephone Encounter (Signed)
Called pt back, I was unable to reach pt, please let pt know that we can send him to a dermatologist within [facility] but they are scheduling into next year.

## 2020-07-18 ENCOUNTER — Other Ambulatory Visit: Payer: Self-pay

## 2020-07-18 ENCOUNTER — Ambulatory Visit (HOSPITAL_COMMUNITY)
Admission: RE | Admit: 2020-07-18 | Discharge: 2020-07-18 | Disposition: A | Discharge status: Home / Self Care | Payer: Medicare Other | Source: Ambulatory Visit | Attending: Gastroenterology | Admitting: Gastroenterology

## 2020-07-18 DIAGNOSIS — B182 Chronic viral hepatitis C: Secondary | ICD-10-CM | POA: Diagnosis not present

## 2020-07-18 DIAGNOSIS — B1920 Unspecified viral hepatitis C without hepatic coma: Secondary | ICD-10-CM | POA: Diagnosis not present

## 2020-07-18 DIAGNOSIS — K7400 Hepatic fibrosis, unspecified: Secondary | ICD-10-CM | POA: Insufficient documentation

## 2020-07-27 NOTE — Progress Notes (Signed)
Subjective:   Brandon Bernard is a 69 y.o. male who presents for Medicare Annual/Subsequent preventive examination.  I connected with Brandon Bernard today by telephone and verified that I am speaking with the correct person using two identifiers. Location patient: home Location provider: work Persons participating in the virtual visit: patient, Marine scientist.    I discussed the limitations, risks, security and privacy concerns of performing an evaluation and management service by telephone and the availability of in person appointments. I also discussed with the patient that there may be a patient responsible charge related to this service. The patient expressed understanding and verbally consented to this telephonic visit.    Interactive audio and video telecommunications were attempted between this provider and patient, however failed, due to patient having technical difficulties OR patient did not have access to video capability.  We continued and completed visit with audio only.  Some vital signs may be absent or patient reported.   Time Spent with patient on telephone encounter: 20 minutes  Review of Systems     Cardiac Risk Factors include: advanced age (>23men, >53 women);male gender     Objective:    Today's Vitals   07/31/20 0815  Weight: 169 lb (76.7 kg)  Height: 5\' 3"  (1.6 m)   Body mass index is 29.94 kg/m.  Advanced Directives 07/31/2020 07/26/2019 07/22/2018  Does Patient Have a Medical Advance Directive? Yes Yes Yes  Type of Paramedic of [place];Living will [place];Living will [place];Living will  Copy of [place] in Chart? No - copy requested No - copy requested No - copy requested    Current Medications (verified) Outpatient Encounter Medications as of 07/31/2020  Medication Sig  . Multiple Vitamin (MULTIVITAMIN) tablet Take 1 tablet by mouth daily.   No facility-administered  encounter medications on file as of 07/31/2020.    Allergies (verified) Minocycline   History: Past Medical History:  Diagnosis Date  . Hepatitis C 2019   treated in Spring 2019   . History of chickenpox   . Seasonal allergies   . Wears glasses    Past Surgical History:  Procedure Laterality Date  . EYE SURGERY Left 1996   Orbital Reconstruction  . FRACTURE SURGERY  2014   Left ankle, Right knee, Right wrist, larynx, Lumbar vertebrae, cervical vertebra  . HERNIA REPAIR  1994  . TONSILLECTOMY    . wisdom teeth ext     Family History  Problem Relation Age of Onset  . Esophageal cancer Neg Hx   . Inflammatory bowel disease Neg Hx   . Liver disease Neg Hx   . Pancreatic cancer Neg Hx   . Stomach cancer Neg Hx   . Rectal cancer Neg Hx   . Colon cancer Neg Hx   . Colon polyps Neg Hx    Social History   Socioeconomic History  . Marital status: Married    Spouse name: Not on file  . Number of children: Not on file  . Years of education: Not on file  . Highest education level: Not on file  Occupational History  . Not on file  Tobacco Use  . Smoking status: Never Smoker  . Smokeless tobacco: Never Used  Vaping Use  . Vaping Use: Never used  Substance and Sexual Activity  . Alcohol use: Yes    Alcohol/week: 1.0 standard drink    Types: 1 Glasses of wine per week  . Drug use: Never  . Sexual  activity: Yes    Partners: Female  Other Topics Concern  . Not on file  Social History Narrative  . Not on file   Social Determinants of Health   Financial Resource Strain: Low Risk   . Difficulty of Paying Living Expenses: Not hard at all  Food Insecurity: No Food Insecurity  . Worried About Charity fundraiser in the Last Year: Never true  . Ran Out of Food in the Last Year: Never true  Transportation Needs: No Transportation Needs  . Lack of Transportation (Medical): No  . Lack of Transportation (Non-Medical): No  Physical Activity: Sufficiently Active  . Days of  Exercise per Week: 7 days  . Minutes of Exercise per Session: 40 min  Stress: No Stress Concern Present  . Feeling of Stress : Not at all  Social Connections: Moderately Integrated  . Frequency of Communication with Friends and Family: More than three times a week  . Frequency of Social Gatherings with Friends and Family: Once a week  . Attends Religious Services: Never  . Active Member of Clubs or Organizations: Yes  . Attends Archivist Meetings: 1 to 4 times per year  . Marital Status: Married    Tobacco Counseling Counseling given: Not Answered   Clinical Intake:  Pre-visit preparation completed: Yes  Pain : No/denies pain     Nutritional Status: BMI 25 -29 Overweight Nutritional Risks: None Diabetes: No  How often do you need to have someone help you when you read instructions, pamphlets, or other written materials from your doctor or pharmacy?: 1 - Never What is the last grade level you completed in school?: 7 yrs of college  Diabetic?No  Interpreter Needed?: No  Information entered by :: Caroleen Hamman LPN   Activities of Daily Living In your present state of health, do you have any difficulty performing the following activities: 07/31/2020  Hearing? N  Vision? N  Difficulty concentrating or making decisions? N  Walking or climbing stairs? N  Dressing or bathing? N  Doing errands, shopping? N  Preparing Food and eating ? N  Using the Toilet? N  In the past six months, have you accidently leaked urine? N  Do you have problems with loss of bowel control? N  Managing your Medications? N  Managing your Finances? N  Housekeeping or managing your Housekeeping? N  Some recent data might be hidden    Patient Care Team: Delorse Limber as PCP - General (Family Medicine) Mansouraty, Telford Nab., MD as Consulting Physician (Gastroenterology) Allyn Kenner, MD (Dermatology) Comer, Okey Regal, MD as Consulting Physician (Infectious  Diseases) Henreitta Leber (Dentistry)  Indicate any recent Medical Services you may have received from other than [facility] providers in the past year (date may be approximate).     Assessment:   This is a routine wellness examination for Brandon Bernard.  Hearing/Vision screen  Hearing Screening   125Hz  250Hz  500Hz  1000Hz  2000Hz  3000Hz  4000Hz  6000Hz  8000Hz   Right ear:           Left ear:           Comments: No issues  Vision Screening Comments: Wears glasses Last eye exam-2019 Dr Marina Gravel  Dietary issues and exercise activities discussed: Current Exercise Habits: Home exercise routine, Type of exercise: walking, Time (Minutes): 40, Frequency (Times/Week): 7, Weekly Exercise (Minutes/Week): 280, Intensity: Mild, Exercise limited by: None identified  Goals    . Patient Stated     Maintain current health.  Depression Screen PHQ 2/9 Scores 07/26/2019 07/22/2018 10/30/2017 09/25/2017  PHQ - 2 Score 0 0 0 0    Fall Risk Fall Risk  07/31/2020 07/26/2019 07/22/2018 10/30/2017 09/25/2017  Falls in the past year? 0 0 No No No  Number falls in past yr: 0 0 - - -  Injury with Fall? 0 0 - - -  Follow up Falls prevention discussed Falls evaluation completed - - -    Any stairs in or around the home? Yes  If so, are there any without handrails? No  Home free of loose throw rugs in walkways, pet beds, electrical cords, etc? Yes  Adequate lighting in your home to reduce risk of falls? Yes   ASSISTIVE DEVICES UTILIZED TO PREVENT FALLS:  Life alert? No  Use of a cane, walker or w/c? No  Grab bars in the bathroom? No  Shower chair or bench in shower? No  Elevated toilet seat or a handicapped toilet? No   TIMED UP AND GO:  Was the test performed? No . Phone visit   Cognitive Function:No cognitive impairment noted MMSE - Mini Mental State Exam 07/26/2019 07/22/2018  Orientation to time 5 5  Orientation to Place 5 5  Registration 3 3  Attention/ Calculation 5 5  Recall 3 3  Language- name 2  objects 2 2  Language- repeat 1 1  Language- follow 3 step command 3 3  Language- read & follow direction 1 1  Write a sentence 1 1  Copy design 1 1  Total score 30 30        Immunizations Immunization History  Administered Date(s) Administered  . PFIZER SARS-COV-2 Vaccination 12/28/2019, 01/18/2020, 07/17/2020  . Pneumococcal Conjugate-13 07/26/2019  . Pneumococcal Polysaccharide-23 10/30/2017  . Tdap 10/22/2011    TDAP status: Up to date   Flu vaccine status: Patient plans to get vaccine tomorrow.  Pneumococcal vaccine status: Up to date   Covid-19 vaccine status: Completed vaccines  Qualifies for Shingles Vaccine? Yes   Zostavax completed No   Shingrix Completed?: No.    Education has been provided regarding the importance of this vaccine. Patient has been advised to call insurance company to determine out of pocket expense if they have not yet received this vaccine. Advised may also receive vaccine at local pharmacy or Health Dept. Verbalized acceptance and understanding.  Screening Tests Health Maintenance  Topic Date Due  . INFLUENZA VACCINE  Never done  . DTAP VACCINES (1) 02/20/2079 (Originally 04/01/1951)  . Fecal DNA (Cologuard)  06/18/2021  . DTaP/Tdap/Td (2 - Td or Tdap) 10/21/2021  . TETANUS/TDAP  10/21/2021  . COVID-19 Vaccine  Completed  . Hepatitis C Screening  Completed  . PNA vac Low Risk Adult  Completed    Health Maintenance  Health Maintenance Due  Topic Date Due  . INFLUENZA VACCINE  Never done    Colorectal cancer screening: Completed Cologuard 06/18/2018. Repeat every 3 years  Lung Cancer Screening: (Low Dose CT Chest recommended if Age 73-80 years, 30 pack-year currently smoking OR have quit w/in 15years.) does not qualify.     Additional Screening:  Hepatitis C Screening:Completed 9/202018  Vision Screening: Recommended annual ophthalmology exams for early detection of glaucoma and other disorders of the eye. Is the patient up to  date with their annual eye exam?  No  Who is the provider or what is the name of the office in which the patient attends annual eye exams? Dr. Marina Gravel Patient is aware that he needs to make  an appt for his yearly eye exam   Dental Screening: Recommended annual dental exams for proper oral hygiene  Community Resource Referral / Chronic Care Management: CRR required this visit?  No   CCM required this visit?  No      Plan:     I have personally reviewed and noted the following in the patient's chart:   . Medical and social history . Use of alcohol, tobacco or illicit drugs  . Current medications and supplements . Functional ability and status . Nutritional status . Physical activity . Advanced directives . List of other physicians . Hospitalizations, surgeries, and ER visits in previous 12 months . Vitals . Screenings to include cognitive, depression, and falls . Referrals and appointments  In addition, I have reviewed and discussed with patient certain preventive protocols, quality metrics, and best practice recommendations. A written personalized care plan for preventive services as well as general preventive health recommendations were provided to patient.    Due to this being a telephonic visit, the after visit summary with patients personalized plan was offered to patient via mail or my-chart.  Patient would like to access on my-chart.   Marta Antu, LPN   [id number]  Nurse Health Advisor  Nurse Notes: None

## 2020-07-31 ENCOUNTER — Ambulatory Visit (INDEPENDENT_AMBULATORY_CARE_PROVIDER_SITE_OTHER): Payer: Medicare Other

## 2020-07-31 VITALS — Ht 63.0 in | Wt 169.0 lb

## 2020-07-31 DIAGNOSIS — Z0000 Encounter for general adult medical examination without abnormal findings: Secondary | ICD-10-CM | POA: Diagnosis not present

## 2020-07-31 NOTE — Patient Instructions (Signed)
[name] , Thank you for taking time to complete your Medicare Wellness Visit. I appreciate your ongoing commitment to your health goals. Please review the following plan we discussed and let me know if I can assist you in the future.   Screening recommendations/referrals: Colonoscopy: Completed Cologuard 06/17/2018- Due 06/18/2021 Recommended yearly ophthalmology/optometry visit for glaucoma screening and checkup Recommended yearly dental visit for hygiene and checkup  Vaccinations: Influenza vaccine: Due- May obtain at our office or your local pharmacy Pneumococcal vaccine: Completed vaccines Tdap vaccine: Up to Date- Due-05/21/2022 Shingles vaccine: Discuss with pharmacy  Covid-19: Completed vaccines  Advanced directives: Please bring a copy for your chart.  Conditions/risks identified:  See problem list  Next appointment: Follow up in one year for your annual wellness visit.   Preventive Care 60 Years and Older, Male Preventive care refers to lifestyle choices and visits with your health care provider that can promote health and wellness. What does preventive care include?  A yearly physical exam. This is also called an annual well check.  Dental exams once or twice a year.  Routine eye exams. Ask your health care provider how often you should have your eyes checked.  Personal lifestyle choices, including:  Daily care of your teeth and gums.  Regular physical activity.  Eating a healthy diet.  Avoiding tobacco and drug use.  Limiting alcohol use.  Practicing safe sex.  Taking low doses of aspirin every day.  Taking vitamin and mineral supplements as recommended by your health care provider. What happens during an annual well check? The services and screenings done by your health care provider during your annual well check will depend on your age, overall health, lifestyle risk factors, and family history of disease. Counseling  Your health care provider may ask you  questions about your:  Alcohol use.  Tobacco use.  Drug use.  Emotional well-being.  Home and relationship well-being.  Sexual activity.  Eating habits.  History of falls.  Memory and ability to understand (cognition).  Work and work Statistician. Screening  You may have the following tests or measurements:  Height, weight, and BMI.  Blood pressure.  Lipid and cholesterol levels. These may be checked every 5 years, or more frequently if you are over 67 years old.  Skin check.  Lung cancer screening. You may have this screening every year starting at age 10 if you have a 30-pack-year history of smoking and currently smoke or have quit within the past 15 years.  Fecal occult blood test (FOBT) of the stool. You may have this test every year starting at age 64.  Flexible sigmoidoscopy or colonoscopy. You may have a sigmoidoscopy every 5 years or a colonoscopy every 10 years starting at age 5.  Prostate cancer screening. Recommendations will vary depending on your family history and other risks.  Hepatitis C blood test.  Hepatitis B blood test.  Sexually transmitted disease (STD) testing.  Diabetes screening. This is done by checking your blood sugar (glucose) after you have not eaten for a while (fasting). You may have this done every 1-3 years.  Abdominal aortic aneurysm (AAA) screening. You may need this if you are a current or former smoker.  Osteoporosis. You may be screened starting at age 56 if you are at high risk. Talk with your health care provider about your test results, treatment options, and if necessary, the need for more tests. Vaccines  Your health care provider may recommend certain vaccines, such as:  Influenza vaccine. This is  recommended every year.  Tetanus, diphtheria, and acellular pertussis (Tdap, Td) vaccine. You may need a Td booster every 10 years.  Zoster vaccine. You may need this after age 68.  Pneumococcal 13-valent conjugate  (PCV13) vaccine. One dose is recommended after age 63.  Pneumococcal polysaccharide (PPSV23) vaccine. One dose is recommended after age 9. Talk to your health care provider about which screenings and vaccines you need and how often you need them. This information is not intended to replace advice given to you by your health care provider. Make sure you discuss any questions you have with your health care provider. Document Released: 11/03/2015 Document Revised: 06/26/2016 Document Reviewed: 08/08/2015 Elsevier Interactive Patient Education  2017 [place] Prevention in the Home Falls can cause injuries. They can happen to people of all ages. There are many things you can do to make your home safe and to help prevent falls. What can I do on the outside of my home?  Regularly fix the edges of walkways and driveways and fix any cracks.  Remove anything that might make you trip as you walk through a door, such as a raised step or threshold.  Trim any bushes or trees on the path to your home.  Use bright outdoor lighting.  Clear any walking paths of anything that might make someone trip, such as rocks or tools.  Regularly check to see if handrails are loose or broken. Make sure that both sides of any steps have handrails.  Any raised decks and porches should have guardrails on the edges.  Have any leaves, snow, or ice cleared regularly.  Use sand or salt on walking paths during winter.  Clean up any spills in your garage right away. This includes oil or grease spills. What can I do in the bathroom?  Use night lights.  Install grab bars by the toilet and in the tub and shower. Do not use towel bars as grab bars.  Use non-skid mats or decals in the tub or shower.  If you need to sit down in the shower, use a plastic, non-slip stool.  Keep the floor dry. Clean up any water that spills on the floor as soon as it happens.  Remove soap buildup in the tub or shower  regularly.  Attach bath mats securely with double-sided non-slip rug tape.  Do not have throw rugs and other things on the floor that can make you trip. What can I do in the bedroom?  Use night lights.  Make sure that you have a light by your bed that is easy to reach.  Do not use any sheets or blankets that are too big for your bed. They should not hang down onto the floor.  Have a firm chair that has side arms. You can use this for support while you get dressed.  Do not have throw rugs and other things on the floor that can make you trip. What can I do in the kitchen?  Clean up any spills right away.  Avoid walking on wet floors.  Keep items that you use a lot in easy-to-reach places.  If you need to reach something above you, use a strong step stool that has a grab bar.  Keep electrical cords out of the way.  Do not use floor polish or wax that makes floors slippery. If you must use wax, use non-skid floor wax.  Do not have throw rugs and other things on the floor that can make you trip.  What can I do with my stairs?  Do not leave any items on the stairs.  Make sure that there are handrails on both sides of the stairs and use them. Fix handrails that are broken or loose. Make sure that handrails are as long as the stairways.  Check any carpeting to make sure that it is firmly attached to the stairs. Fix any carpet that is loose or worn.  Avoid having throw rugs at the top or bottom of the stairs. If you do have throw rugs, attach them to the floor with carpet tape.  Make sure that you have a light switch at the top of the stairs and the bottom of the stairs. If you do not have them, ask someone to add them for you. What else can I do to help prevent falls?  Wear shoes that:  Do not have high heels.  Have rubber bottoms.  Are comfortable and fit you well.  Are closed at the toe. Do not wear sandals.  If you use a stepladder:  Make sure that it is fully  opened. Do not climb a closed stepladder.  Make sure that both sides of the stepladder are locked into place.  Ask someone to hold it for you, if possible.  Clearly mark and make sure that you can see:  Any grab bars or handrails.  First and last steps.  Where the edge of each step is.  Use tools that help you move around (mobility aids) if they are needed. These include:  Canes.  Walkers.  Scooters.  Crutches.  Turn on the lights when you go into a dark area. Replace any light bulbs as soon as they burn out.  Set up your furniture so you have a clear path. Avoid moving your furniture around.  If any of your floors are uneven, fix them.  If there are any pets around you, be aware of where they are.  Review your medicines with your doctor. Some medicines can make you feel dizzy. This can increase your chance of falling. Ask your doctor what other things that you can do to help prevent falls. This information is not intended to replace advice given to you by your health care provider. Make sure you discuss any questions you have with your health care provider. Document Released: 08/03/2009 Document Revised: 03/14/2016 Document Reviewed: 11/11/2014 Elsevier Interactive Patient Education  2017 [employer].

## 2020-08-01 ENCOUNTER — Encounter: Payer: Medicare Other | Admitting: Physician Assistant

## 2020-08-09 ENCOUNTER — Ambulatory Visit (INDEPENDENT_AMBULATORY_CARE_PROVIDER_SITE_OTHER): Payer: Medicare Other | Admitting: Physician Assistant

## 2020-08-09 ENCOUNTER — Encounter: Payer: Self-pay | Admitting: Physician Assistant

## 2020-08-09 ENCOUNTER — Other Ambulatory Visit: Payer: Self-pay

## 2020-08-09 VITALS — BP 120/78 | HR 63 | Temp 98.2°F | Resp 14 | Ht 63.0 in | Wt 167.0 lb

## 2020-08-09 DIAGNOSIS — Z125 Encounter for screening for malignant neoplasm of prostate: Secondary | ICD-10-CM

## 2020-08-09 DIAGNOSIS — E785 Hyperlipidemia, unspecified: Secondary | ICD-10-CM | POA: Diagnosis not present

## 2020-08-09 DIAGNOSIS — L729 Follicular cyst of the skin and subcutaneous tissue, unspecified: Secondary | ICD-10-CM | POA: Diagnosis not present

## 2020-08-09 DIAGNOSIS — Z23 Encounter for immunization: Secondary | ICD-10-CM | POA: Diagnosis not present

## 2020-08-09 LAB — COMPREHENSIVE METABOLIC PANEL
ALT: 13 U/L (ref 0–53)
AST: 20 U/L (ref 0–37)
Albumin: 4.6 g/dL (ref 3.5–5.2)
Alkaline Phosphatase: 57 U/L (ref 39–117)
BUN: 13 mg/dL (ref 6–23)
CO2: 32 mEq/L (ref 19–32)
Calcium: 9.7 mg/dL (ref 8.4–10.5)
Chloride: 103 mEq/L (ref 96–112)
Creatinine, Ser: 0.77 mg/dL (ref 0.40–1.50)
GFR: 92.9 mL/min (ref >60.00)
Glucose, Bld: 100 mg/dL — ABNORMAL HIGH (ref 70–99)
Potassium: 4.4 mEq/L (ref 3.5–5.1)
Sodium: 141 mEq/L (ref 135–145)
Total Bilirubin: 0.7 mg/dL (ref 0.2–1.2)
Total Protein: 7.3 g/dL (ref 6.0–8.3)

## 2020-08-09 LAB — LIPID PANEL
Cholesterol: 198 mg/dL (ref 0–200)
HDL: 84.4 mg/dL (ref >39.00)
LDL Cholesterol: 104 mg/dL — ABNORMAL HIGH (ref 0–99)
NonHDL: 113.11
Total CHOL/HDL Ratio: 2
Triglycerides: 47 mg/dL (ref 0.0–149.0)
VLDL: 9.4 mg/dL (ref 0.0–40.0)

## 2020-08-09 LAB — PSA, MEDICARE: PSA: 2.16 ng/ml (ref 0.10–4.00)

## 2020-08-09 NOTE — Progress Notes (Signed)
Patient presents to clinic today for annual follow-up. Patient recently completed [facility] with [facility]. Has history of borderline cholesterol, not currently on medication. Also with hx of liver fibrosis 2/2 prior hep c infection, followed by Gastroenterology. Is wanting his annual flu shot today. Is fasting for labs.   Patient endorses two bumps on his back at site of a prior biopsy that have been present for 4 months. Are occasionally itchy but are non painful. Denies noted change in size or color of lesions since onset.   Past Medical History:  Diagnosis Date  . Hepatitis C 2019   treated in Spring 2019   . History of chickenpox   . Seasonal allergies   . Wears glasses     Current Outpatient Medications on File Prior to Visit  Medication Sig Dispense Refill  . Multiple Vitamin (MULTIVITAMIN) tablet Take 1 tablet by mouth daily.     No current facility-administered medications on file prior to visit.    Allergies  Allergen Reactions  . Minocycline Other (See Comments)    Candidiasis    Family History  Problem Relation Age of Onset  . Esophageal cancer Neg Hx   . Inflammatory bowel disease Neg Hx   . Liver disease Neg Hx   . Pancreatic cancer Neg Hx   . Stomach cancer Neg Hx   . Rectal cancer Neg Hx   . Colon cancer Neg Hx   . Colon polyps Neg Hx     Social History   Socioeconomic History  . Marital status: Married    Spouse name: Not on file  . Number of children: Not on file  . Years of education: Not on file  . Highest education level: Not on file  Occupational History  . Not on file  Tobacco Use  . Smoking status: Never Smoker  . Smokeless tobacco: Never Used  Vaping Use  . Vaping Use: Never used  Substance and Sexual Activity  . Alcohol use: Yes    Alcohol/week: 1.0 standard drink    Types: 1 Glasses of wine per week  . Drug use: Never  . Sexual activity: Yes    Partners: Female  Other Topics Concern  . Not on file  Social History Narrative    . Not on file   Social Determinants of Health   Financial Resource Strain: Low Risk   . Difficulty of Paying Living Expenses: Not hard at all  Food Insecurity: No Food Insecurity  . Worried About Charity fundraiser in the Last Year: Never true  . Ran Out of Food in the Last Year: Never true  Transportation Needs: No Transportation Needs  . Lack of Transportation (Medical): No  . Lack of Transportation (Non-Medical): No  Physical Activity: Sufficiently Active  . Days of Exercise per Week: 7 days  . Minutes of Exercise per Session: 40 min  Stress: No Stress Concern Present  . Feeling of Stress : Not at all  Social Connections: Moderately Integrated  . Frequency of Communication with Friends and Family: More than three times a week  . Frequency of Social Gatherings with Friends and Family: Once a week  . Attends Religious Services: Never  . Active Member of Clubs or Organizations: Yes  . Attends Archivist Meetings: 1 to 4 times per year  . Marital Status: Married   Review of Systems - See HPI.  All other ROS are negative.  Resp 14   Ht _0  (1.6 m)  Wt 167 lb (75.8 kg)   BMI 29.58 kg/m   Physical Exam Vitals reviewed.  Constitutional:      Appearance: Normal appearance.  HENT:     Head: Normocephalic and atraumatic.  Cardiovascular:     Rate and Rhythm: Normal rate and regular rhythm.     Pulses: Normal pulses.     Heart sounds: Normal heart sounds.  Pulmonary:     Effort: Pulmonary effort is normal.     Breath sounds: Normal breath sounds.  Musculoskeletal:     Cervical back: Neck supple.  Skin:      Neurological:     General: No focal deficit present.     Mental Status: He is alert and oriented to person, place, and time.  Psychiatric:        Mood and Affect: Mood normal.      Assessment/Plan: 1. Hyperlipidemia, unspecified hyperlipidemia type Dietary and exercise recommendations reviewed. Will check fasting labs today.  - Lipid Profile -  Comp Met (CMET)  2. Prostate cancer screening  He indicates understanding of the limitations of this screening test and wishes to proceed with screening PSA testing.  - PSA, Medicare  3. Need for immunization against influenza High-dose flu vaccine given today. - Flu Vaccine QUAD High Dose(Fluad)  4. Cyst of skin Patient has dermatologist. Giving this is located at sight of previous precancerous lesion, want him to see his specialist for evaluation and potential removal. He agrees to schedule appt.   This visit occurred during the SARS-CoV-2 public health emergency.  Safety protocols were in place, including screening questions prior to the visit, additional usage of staff PPE, and extensive cleaning of exam room while observing appropriate contact time as indicated for disinfecting solutions.     Leeanne Rio, PA-C

## 2020-08-09 NOTE — Patient Instructions (Addendum)
Please go to the lab today for blood work.  I will call you with your results. We will alter treatment regimen(s) if indicated by your results.   Please contact your Dermatologist to move up appointment giving the new cystic region giving it is at site of prior biopsy.   Please start daily saline nasal rinse and OTC Flonase for post-nasal drip. Let me know if things are not improving.

## 2020-08-10 ENCOUNTER — Other Ambulatory Visit (INDEPENDENT_AMBULATORY_CARE_PROVIDER_SITE_OTHER): Payer: Medicare Other

## 2020-08-10 DIAGNOSIS — R7309 Other abnormal glucose: Secondary | ICD-10-CM | POA: Diagnosis not present

## 2020-08-10 LAB — HEMOGLOBIN A1C: Hgb A1c MFr Bld: 5.7 % (ref 4.6–6.5)

## 2020-12-13 ENCOUNTER — Other Ambulatory Visit: Payer: Self-pay

## 2020-12-13 ENCOUNTER — Encounter: Payer: Self-pay | Admitting: Physician Assistant

## 2020-12-13 ENCOUNTER — Ambulatory Visit (INDEPENDENT_AMBULATORY_CARE_PROVIDER_SITE_OTHER): Payer: Medicare Other | Admitting: Physician Assistant

## 2020-12-13 DIAGNOSIS — D239 Other benign neoplasm of skin, unspecified: Secondary | ICD-10-CM

## 2020-12-13 DIAGNOSIS — L910 Hypertrophic scar: Secondary | ICD-10-CM | POA: Diagnosis not present

## 2020-12-13 DIAGNOSIS — C44519 Basal cell carcinoma of skin of other part of trunk: Secondary | ICD-10-CM

## 2020-12-13 DIAGNOSIS — C4491 Basal cell carcinoma of skin, unspecified: Secondary | ICD-10-CM

## 2020-12-13 DIAGNOSIS — D2371 Other benign neoplasm of skin of right lower limb, including hip: Secondary | ICD-10-CM

## 2020-12-13 DIAGNOSIS — L905 Scar conditions and fibrosis of skin: Secondary | ICD-10-CM | POA: Diagnosis not present

## 2020-12-13 DIAGNOSIS — L821 Other seborrheic keratosis: Secondary | ICD-10-CM

## 2020-12-13 DIAGNOSIS — D485 Neoplasm of uncertain behavior of skin: Secondary | ICD-10-CM

## 2020-12-13 HISTORY — DX: Basal cell carcinoma of skin, unspecified: C44.91

## 2020-12-13 MED ORDER — TRIAMCINOLONE ACETONIDE 10 MG/ML IJ SUSP
20.0000 mg | Freq: Once | INTRAMUSCULAR | Status: DC
Start: 2020-12-13 — End: 2020-12-13

## 2020-12-13 MED ORDER — TRIAMCINOLONE ACETONIDE 40 MG/ML IJ SUSP
20.0000 mg | Freq: Once | INTRAMUSCULAR | Status: AC
  Administered 2020-12-13: 20 mg

## 2020-12-13 NOTE — Progress Notes (Signed)
New Patient   Subjective  Brandon Bernard is a 70 y.o. male who presents for the following: Cyst (Cyst on upper back x 1 year no drainage itches /Spot on right ankle x 18 months discolored dark, feels hard to touch).   The following portions of the chart were reviewed this encounter and updated as appropriate:  Tobacco  Allergies  Meds  Problems  Med Hx  Surg Hx  Fam Hx      Objective  Well appearing patient in no apparent distress; mood and affect are within normal limits.  All skin waist up examined.  Objective  Right Upper Back: Hyperkeratotic scale with pink base        Objective  Left Flank: Pearly papule with telangectasia.        Objective  Right Inner Ankle, Right Upper Back: Stuck-on, waxy papules and plaques.   Objective  Right Ankle - Anterior: Brown macule with white discoloration in the center. Slightly palpable  Objective  Lower Mid Back: Dyspigmented scar.   Objective  Left Upper Back: Firm pink dermal papules/plaques c/w keloid.   Assessment & Plan  Neoplasm of uncertain behavior of skin (2) Right Upper Back  Skin / nail biopsy Type of biopsy: tangential   Informed consent: discussed and consent obtained   Timeout: patient name, date of birth, surgical site, and procedure verified   Procedure prep:  Patient was prepped and draped in usual sterile fashion (Non sterile) Prep type:  Chlorhexidine Anesthesia: the lesion was anesthetized in a standard fashion   Anesthetic:  1% lidocaine w/ epinephrine 1-100,000 local infiltration Instrument used: flexible razor blade   Hemostasis achieved with: aluminum chloride and electrodesiccation   Outcome: patient tolerated procedure well   Post-procedure details: sterile dressing applied and wound care instructions given   Dressing type: bandage and petrolatum    Specimen 1 - Surgical pathology Differential Diagnosis: R/O BCC vs SCC  Check Margins: No  Cautery after biopsy  Left  Flank  Skin / nail biopsy Type of biopsy: tangential   Informed consent: discussed and consent obtained   Timeout: patient name, date of birth, surgical site, and procedure verified   Procedure prep:  Patient was prepped and draped in usual sterile fashion (Non sterile) Prep type:  Chlorhexidine Anesthesia: the lesion was anesthetized in a standard fashion   Anesthetic:  1% lidocaine w/ epinephrine 1-100,000 local infiltration Instrument used: flexible razor blade   Hemostasis achieved with: aluminum chloride and electrodesiccation   Outcome: patient tolerated procedure well   Post-procedure details: sterile dressing applied and wound care instructions given   Dressing type: bandage and petrolatum    Specimen 2 - Surgical pathology Differential Diagnosis: R/O BCC vs SCC  Check Margins: No  Cautery after biopsy  Seborrheic keratosis (2) Right Inner Ankle; Right Upper Back  observe  Dermatofibroma Right Ankle - Anterior  Benign okay to leave if stable  Scar Lower Mid Back  observe  Keloid Left Upper Back  Intralesional injection - Left Upper Back Location: Left Upper Back  Informed Consent: Discussed risks (infection, pain, bleeding, bruising, thinning of the skin, loss of skin pigment,  Indentation, lack of resolution, and recurrence of lesion) and benefits of the procedure, as well as the alternatives. Informed consent was obtained. Preparation: The area was prepared in a standard fashion.   Procedure Details: An intralesional injection was performed with Kenalog 20 mg/cc.  0.5cc in total were injected.  Total number of injections:2  Plan: The patient was instructed  on post-op care. Recommend OTC analgesia as needed for pain.   triamcinolone acetonide (KENALOG-40) injection 20 mg - Left Upper Back    I, SHEFFIELD,KELLI, PA-C, have reviewed all documentation for this visit. The documentation on 01/04/21 for the exam, diagnosis, procedures, and orders are all  accurate and complete.

## 2020-12-13 NOTE — Patient Instructions (Signed)

## 2020-12-15 ENCOUNTER — Ambulatory Visit: Payer: Medicare Other | Admitting: Physician Assistant

## 2020-12-18 ENCOUNTER — Encounter: Payer: Self-pay | Admitting: Physician Assistant

## 2020-12-18 ENCOUNTER — Telehealth: Payer: Self-pay

## 2020-12-18 NOTE — Telephone Encounter (Signed)
Left message to call office 1 hour exc needed

## 2020-12-18 NOTE — Telephone Encounter (Signed)
-----   Message from Warren Danes, [place] sent at 12/18/2020  9:35 AM EST ----- 30 for excision 2...when returns for suture removal ED&c 1

## 2020-12-19 NOTE — Telephone Encounter (Signed)
Left message to call office

## 2020-12-19 NOTE — Telephone Encounter (Signed)
Patient left message on office voice mail that he is returning call about results.

## 2020-12-20 ENCOUNTER — Encounter: Payer: Self-pay | Admitting: Physician Assistant

## 2020-12-20 ENCOUNTER — Telehealth: Payer: Self-pay | Admitting: Physician Assistant

## 2020-12-20 NOTE — Telephone Encounter (Signed)
Says we keep calling him but he doesn't know why. Says you can leave a message at least letting him know we are calling

## 2020-12-20 NOTE — Telephone Encounter (Signed)
Pathology to patient wife Madaline Savage.  Surgery appointment already scheduled.  Carley Hammed to have patient call us if he has any questions.

## 2021-01-30 ENCOUNTER — Other Ambulatory Visit: Payer: Self-pay

## 2021-01-30 ENCOUNTER — Ambulatory Visit (INDEPENDENT_AMBULATORY_CARE_PROVIDER_SITE_OTHER): Payer: Medicare Other | Admitting: Physician Assistant

## 2021-01-30 ENCOUNTER — Encounter: Payer: Self-pay | Admitting: Physician Assistant

## 2021-01-30 DIAGNOSIS — C4491 Basal cell carcinoma of skin, unspecified: Secondary | ICD-10-CM

## 2021-01-30 DIAGNOSIS — C44519 Basal cell carcinoma of skin of other part of trunk: Secondary | ICD-10-CM | POA: Diagnosis not present

## 2021-01-30 NOTE — Patient Instructions (Signed)

## 2021-01-30 NOTE — Progress Notes (Addendum)
   Follow-Up Visit   Subjective  Brandon Bernard is a 70 y.o. male who presents for the following: Procedure (Patient here today for treatment of BCC x 1 on left flank.).   The following portions of the chart were reviewed this encounter and updated as appropriate:  Tobacco  Allergies  Meds  Problems  Med Hx  Surg Hx  Fam Hx      Objective  Well appearing patient in no apparent distress; mood and affect are within normal limits.  A focused examination was performed including back and left flank. Relevant physical exam findings are noted in the Assessment and Plan.  Objective  Left Flank: Pink macule  Assessment & Plan  Basal cell carcinoma (BCC), unspecified site Left Flank  Skin excision  Total excision diameter (cm):  5.3 Informed consent: discussed and consent obtained   Timeout: patient name, date of birth, surgical site, and procedure verified   Anesthesia: the lesion was anesthetized in a standard fashion   Anesthetic:  1% lidocaine w/ epinephrine 1-100,000 local infiltration Instrument used: #15 blade   Hemostasis achieved with: pressure and electrodesiccation   Outcome: patient tolerated procedure well with no complications   Post-procedure details: sterile dressing applied and wound care instructions given   Dressing type: bandage, petrolatum and pressure dressing   Additional details:  3-0 vicryl x 6 4-0 Ethilon x 6 (1 horizontal mattress on posterior edge)  Skin repair Complexity:  Intermediate Final length (cm):  5.3 Informed consent: discussed and consent obtained   Timeout: patient name, date of birth, surgical site, and procedure verified   Procedure prep:  Patient was prepped and draped in usual sterile fashion Prep type:  Chlorhexidine Anesthesia: the lesion was anesthetized in a standard fashion   Reason for type of repair: reduce tension to allow closure, reduce the risk of dehiscence, infection, and necrosis, reduce subcutaneous dead space and  avoid a hematoma, allow closure of the large defect, preserve normal anatomy, preserve normal anatomical and functional relationships and enhance both functionality and cosmetic results   Undermining: edges undermined   Subcutaneous layers (deep stitches):  Suture size:  3-0 Suture type: Vicryl Rapide (coated polyglactin 910) and Vicryl (polyglactin 910)   Stitches:  Buried vertical mattress Fine/surface layer approximation (top stitches):  Suture size:  4-0 Suture type: Monocryl (poliglecaprone 25)   Suture type comment:  Nylon Stitches: simple interrupted   Hemostasis achieved with: suture Outcome: patient tolerated procedure well with no complications   Post-procedure details: wound care instructions given    Specimen 1 - Surgical pathology Differential Diagnosis: bcc [id number] Posterior Margin stained  Check Margins: No   I, Marline Morace, PA-C, have reviewed all documentation's for this visit.  The documentation on 01/30/21 for the exam, diagnosis, procedures and orders are all accurate and complete.

## 2021-02-12 ENCOUNTER — Other Ambulatory Visit: Payer: Self-pay

## 2021-02-12 ENCOUNTER — Ambulatory Visit (INDEPENDENT_AMBULATORY_CARE_PROVIDER_SITE_OTHER): Payer: Medicare Other | Admitting: *Deleted

## 2021-02-12 DIAGNOSIS — Z4802 Encounter for removal of sutures: Secondary | ICD-10-CM

## 2021-02-12 NOTE — Progress Notes (Signed)
Patient here for suture removal on left flank- no sign or symptoms of infection- patient told to call next week for pathology results, due to results not being back today.

## 2021-04-03 IMAGING — US US LIVER ELASTOGRAPHY
1 series · 12 of 25 positions shown · non-contrast
Comparison: 07/27/2019

CLINICAL DATA: Hepatitis C, liver fibrosis, follow-up

EXAM:
US LIVER ELASTOGRAPHY
TECHNIQUE: Sonography of the liver was performed. In addition, ultrasound
elastography evaluation of the liver was performed. A region of
interest was placed within the right lobe of the liver. Following
application of a compressive sonographic pulse, tissue
compressibility was assessed. Multiple assessments were performed at
the selected site. Median tissue compressibility was determined.
Previously, hepatic stiffness was assessed by shear wave velocity.
Based on recently published Society of Radiologists in Ultrasound
consensus article, reporting is now recommended to be performed in
the SI units of pressure (kiloPascals) representing hepatic
stiffness/elasticity. The obtained result is compared to the
published reference standards. (cACLD = compensated Advanced Chronic
Liver Disease)

[Series 1: us liver elastography · 12 of 56 slices shown]
[im 3/56]
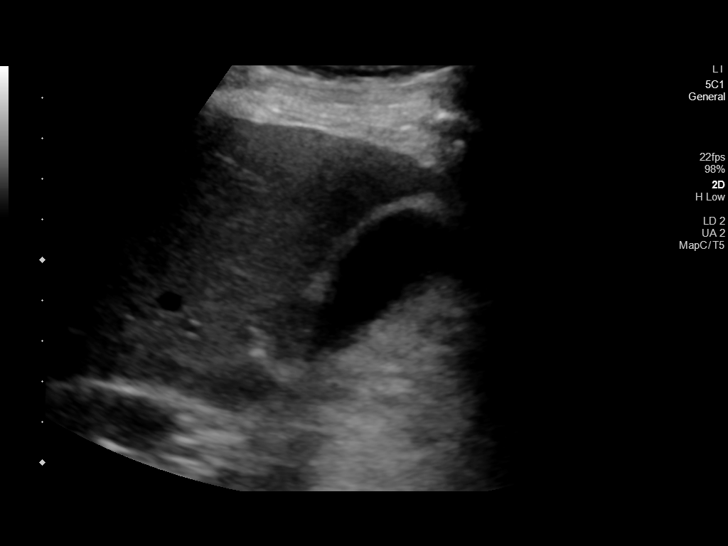
[im 7/56]
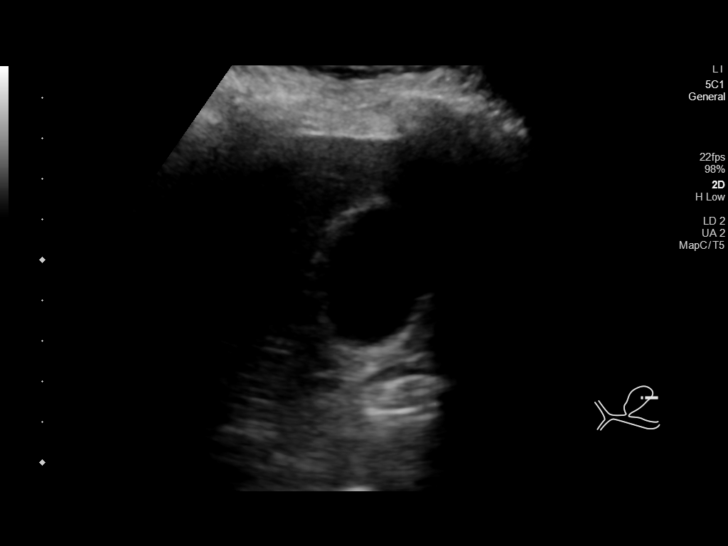
[im 12/56]
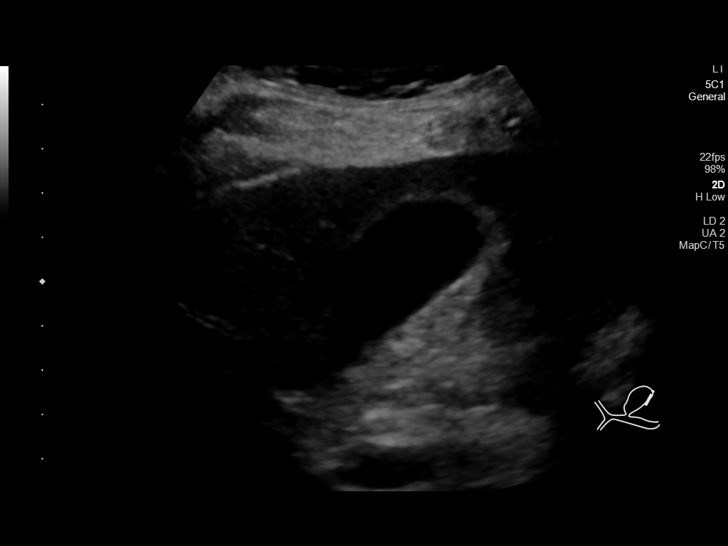
[im 17/56]
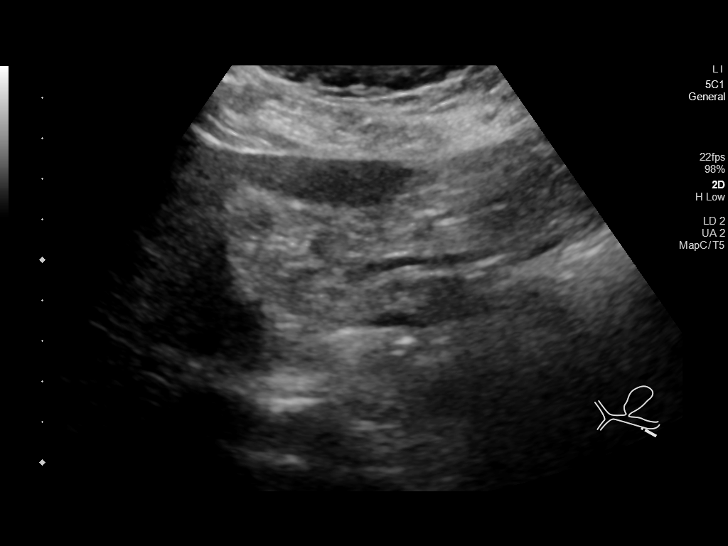
[im 21/56]
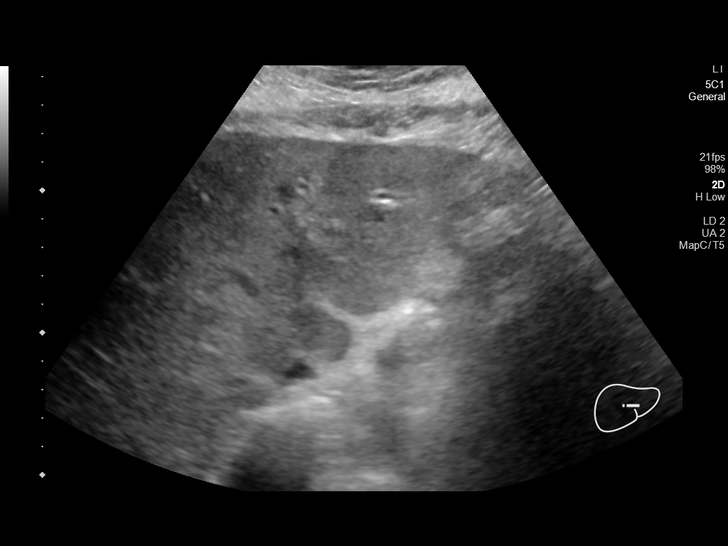
[im 26/56]
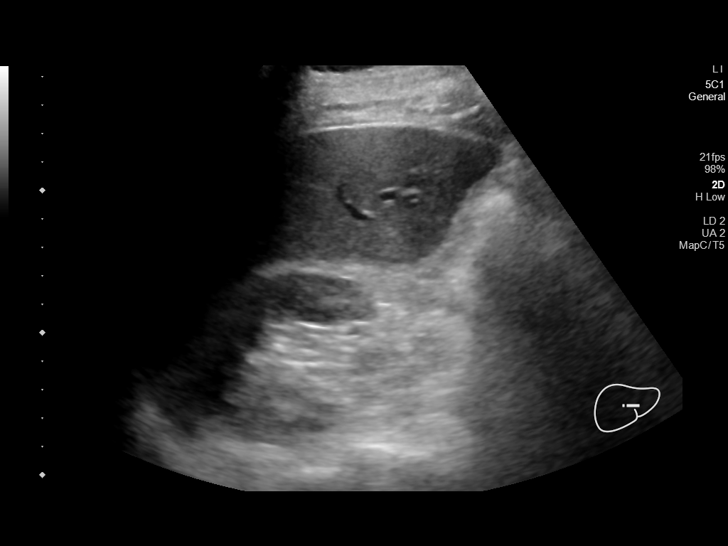
[im 30/56]
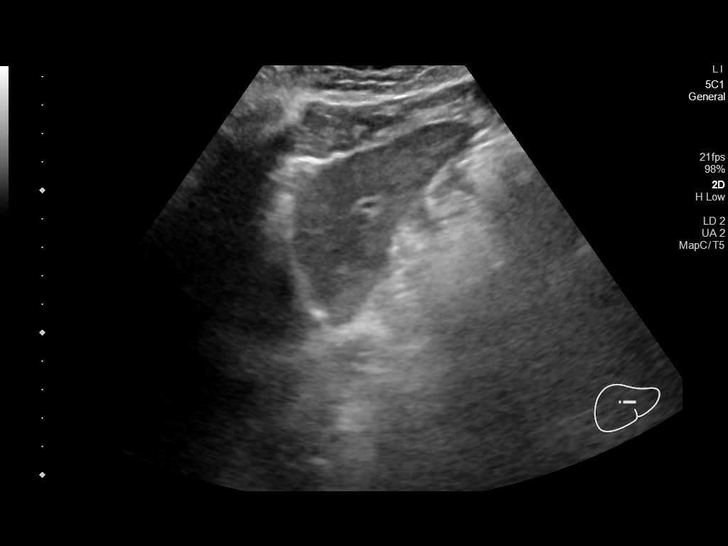
[im 35/56]
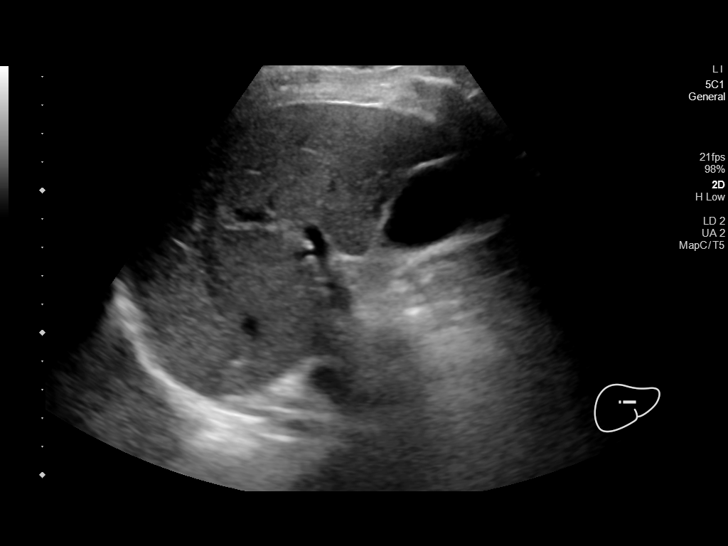
[im 39/56]
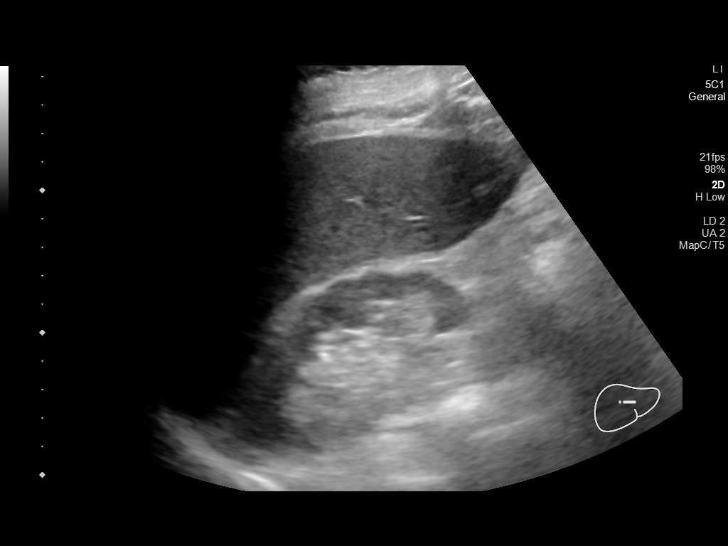
[im 44/56]
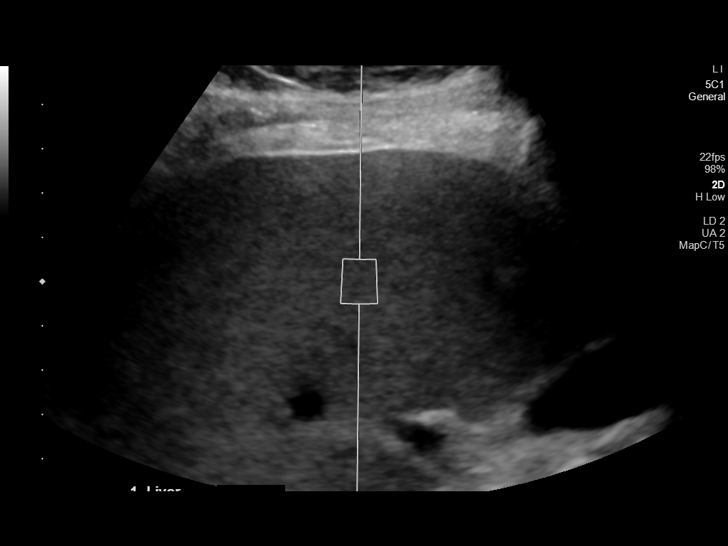
[im 49/56]
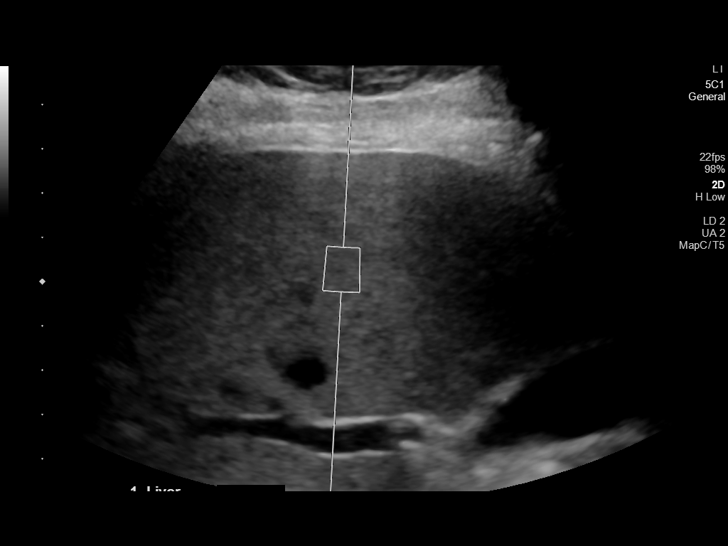
[im 53/56]
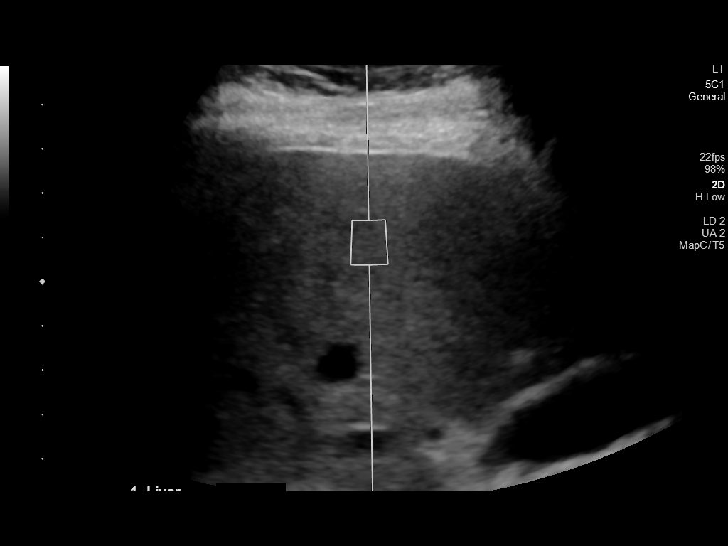

[12 of 25 positions shown; findings below may reference images not displayed]

FINDINGS: Liver: Normal echogenicity without mass or nodularity. Portal vein
is patent on color Doppler imaging with normal direction of blood
flow towards the liver. No RIGHT upper quadrant free fluid.

ULTRASOUND HEPATIC ELASTOGRAPHY

Device: Siemens Helix VTQ

Patient position: Not recorded

Transducer: [id number]

Number of measurements: 10

Hepatic segment:  8

Median kPa:

IQR:

IQR/Median kPa ratio:

Data quality: IQR/Median kPa ratio of 0.3 or greater indicates
reduced accuracy

Limitations: Inconsistent respiratory motion. Multiple hepatic sites
were sampled rather than same hepatic site multiple times.

Diagnostic category:  Nondiagnostic exam

The use of hepatic elastography is applicable to patients with viral
hepatitis and non-alcoholic fatty liver disease. At this time, there
is insufficient data for the referenced cut-off values and use in
other causes of liver disease, including alcoholic liver disease.
Patients, however, may be assessed by elastography and serve as
their own reference standard/baseline.

In patients with non-alcoholic liver disease, the values suggesting
compensated advanced chronic liver disease (cACLD) may be lower, and
patients may need additional testing with elasticity results of [DATE]
kPa.

Please note that abnormal hepatic elasticity and shear wave
velocities may also be identified in clinical settings other than
with hepatic fibrosis, such as: acute hepatitis, elevated right
heart and central venous pressures including use of beta blockers,
Alem disease (Blain), infiltrative processes such as
mastocytosis/amyloidosis/infiltrative tumor/lymphoma, extrahepatic
cholestasis, with hyperemia in the post-prandial state, and with
liver transplantation. Correlation with patient history, laboratory
data, and clinical condition recommended.

Diagnostic Categories:

< or =5 kPa: high probability of being normal

< or =9 kPa: in the absence of other known clinical signs, rules [DATE] kPa and ?13 kPa: suggestive of cACLD, but needs further testing

>13 kPa: highly suggestive of cACLD

> or =17 kPa: highly suggestive of cACLD with an increased
probability of clinically significant portal hypertension
IMPRESSION: ULTRASOUND LIVER:

No focal sonographic abnormalities.

ULTRASOUND HEPATIC ELASTOGRAPHY:

Nondiagnostic elastography exam.

Ultrasound department has attempted multiple times to reach the
patient and have him return for repeat imaging.

Thus far they have been unable to reach the patient; recommend
patient be rescheduled for repeat elastography.

## 2021-04-05 ENCOUNTER — Ambulatory Visit (INDEPENDENT_AMBULATORY_CARE_PROVIDER_SITE_OTHER): Payer: Medicare Other | Admitting: Dermatology

## 2021-04-05 ENCOUNTER — Encounter: Payer: Self-pay | Admitting: Dermatology

## 2021-04-05 ENCOUNTER — Other Ambulatory Visit: Payer: Self-pay

## 2021-04-05 DIAGNOSIS — C44519 Basal cell carcinoma of skin of other part of trunk: Secondary | ICD-10-CM | POA: Diagnosis not present

## 2021-04-05 DIAGNOSIS — C4491 Basal cell carcinoma of skin, unspecified: Secondary | ICD-10-CM

## 2021-04-05 NOTE — Patient Instructions (Signed)

## 2021-04-11 ENCOUNTER — Other Ambulatory Visit: Payer: Self-pay

## 2021-04-11 ENCOUNTER — Ambulatory Visit (INDEPENDENT_AMBULATORY_CARE_PROVIDER_SITE_OTHER): Payer: Medicare Other | Admitting: Family Medicine

## 2021-04-11 VITALS — BP 124/80 | HR 64 | Temp 98.2°F | Resp 16 | Ht 63.0 in | Wt 169.8 lb

## 2021-04-11 DIAGNOSIS — M79672 Pain in left foot: Secondary | ICD-10-CM | POA: Diagnosis not present

## 2021-04-11 DIAGNOSIS — M722 Plantar fascial fibromatosis: Secondary | ICD-10-CM | POA: Diagnosis not present
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
## 2021-04-11 NOTE — Patient Instructions (Signed)
Your foot pain does appear to be coming from plantar fasciitis.  See information below a few exercises you can try..  Try that plantar fascia stretch we discussed in the office, ice massage is fine.  Night splints are okay if needed.  I expect symptoms to be improving hopefully within the next few weeks, but if not let me know and I am happy to refer you to a podiatrist.  I do not think this is related to your ankle surgery previously, but if any new redness, swelling or worsening symptoms we certainly can x-ray and recheck of that area.  Thank you for coming in today.  Plantar Fasciitis Rehab Ask your health care provider which exercises are safe for you. Do exercises exactly as told by your health care provider and adjust them as directed. It is normal to feel mild stretching, pulling, tightness, or discomfort as you do these exercises. Stop right away if you feel sudden pain or your pain gets worse. Do not begin these exercises until told by your health care provider. Stretching and range-of-motion exercises These exercises warm up your muscles and joints and improve the movement andflexibility of your foot. These exercises also help to relieve pain. Plantar fascia stretch  Sit with your left / right leg crossed over your opposite knee. Hold your heel with one hand with that thumb near your arch. With your other hand, hold your toes and gently pull them back toward the top of your foot. You should feel a stretch on the base (bottom) of your toes, or the bottom of your foot (plantar fascia), or both. Hold this stretch for__________ seconds. Slowly release your toes and return to the starting position. Repeat __________ times. Complete this exercise __________ times a day. Gastrocnemius stretch, standing This exercise is also called a calf (gastroc) stretch. It stretches the muscles in the back of the upper calf. Stand with your hands against a wall. Extend your left / right leg behind you, and bend  your front knee slightly. Keeping your heels on the floor, your toes facing forward, and your back knee straight, shift your weight toward the wall. Do not arch your back. You should feel a gentle stretch in your upper calf. Hold this position for __________ seconds. Repeat __________ times. Complete this exercise __________ times a day. Soleus stretch, standing This exercise is also called a calf (soleus) stretch. It stretches the muscles in the back of the lower calf. Stand with your hands against a wall. Extend your left / right leg behind you, and bend your front knee slightly. Keeping your heels on the floor and your toes facing forward, bend your back knee and shift your weight slightly over your back leg. You should feel a gentle stretch deep in your lower calf. Hold this position for __________ seconds. Repeat __________ times. Complete this exercise __________ times a day. Gastroc and soleus stretch, standing step This exercise stretches the muscles in the back of the lower leg. These muscles are in the upper calf (gastrocnemius) and the lower calf (soleus). Stand with the ball of your left / right foot on the front of a step. The ball of your foot is on the walking surface, right under your toes. Keep your other foot firmly on the same step. Hold on to the wall or a railing for balance. Slowly lift your other foot, allowing your body weight to press your heel down over the edge of the front of the step. Keep knee straight and  unbent. You should feel a stretch in your calf. Hold this position for __________ seconds. Return both feet to the step. Repeat this exercise with a slight bend in your left / right knee. Repeat __________ times with your left / right knee straight and __________ times with your left / right knee bent. Complete this exercise __________ timesa day. Balance exercise This exercise builds your balance and strength control of your arch to helptake pressure off your  plantar fascia. Single leg stand If this exercise is too easy, you can try it with your eyes closed or while standing on a pillow. Without shoes, stand near a railing or in a doorway. You may hold on to the railing or door frame as needed. Stand on your left / right foot. Keep your big toe down on the floor and lift the arch of your foot. You should feel a stretch across the bottom of your foot and your arch. Do not let your foot roll inward. Hold this position for __________ seconds. Repeat __________ times. Complete this exercise __________ times a day. This information is not intended to replace advice given to you by your health care provider. Make sure you discuss any questions you have with your healthcare provider. Document Revised: 07/20/2020 Document Reviewed: 07/20/2020 Elsevier Patient Education  2022 [place]. Plantar Fasciitis  Plantar fasciitis is a painful foot condition that affects the heel. It occurs when the band of tissue that connects the toes to the heel bone (plantar fascia) becomes irritated. This can happen as the result of exercising too much or doing other repetitive activities (overuse injury). Plantar fasciitis can cause mild irritation to severe pain that makes it difficult to walk or move. The pain is usually worse in the morning after sleeping, or after sitting or lying down for a period of time. Pain may also beworse after long periods of walking or standing. What are the causes? This condition may be caused by: Standing for long periods of time. Wearing shoes that do not have good arch support. Doing activities that put stress on joints (high-impact activities). This includes ballet and exercise that makes your heart beat faster (aerobic exercise), such as running. Being overweight. An abnormal way of walking (gait). Tight muscles in the back of your lower leg (calf). High arches in your feet or flat feet. Starting a new athletic activity. What are the  signs or symptoms? The main symptom of this condition is heel pain. Pain may get worse after the following: Taking the first steps after a time of rest, especially in the morning after awakening, or after you have been sitting or lying down for a while. Long periods of standing still. Pain may decrease after 30-45 minutes of activity, such as gentle walking. How is this diagnosed? This condition may be diagnosed based on your medical history, a physical exam, and your symptoms. Your health care provider will check for: A tender area on the bottom of your foot. A high arch in your foot or flat feet. Pain when you move your foot. Difficulty moving your foot. You may have imaging tests to confirm the diagnosis, such as: X-rays. Ultrasound. MRI. How is this treated? Treatment for plantar fasciitis depends on how severe your condition is. Treatment may include: Rest, ice, pressure (compression), and raising (elevating) the affected foot. This is called RICE therapy. Your health care provider may recommend RICE therapy along with over-the-counter pain medicines to manage your pain. Exercises to stretch your calves and your plantar  fascia. A splint that holds your foot in a stretched, upward position while you sleep (night splint). Physical therapy to relieve symptoms and prevent problems in the future. Injections of steroid medicine (cortisone) to relieve pain and inflammation. Stimulating your plantar fascia with electrical impulses (extracorporeal shock wave therapy). This is usually the last treatment option before surgery. Surgery, if other treatments have not worked after 12 months. Follow these instructions at home: Managing pain, stiffness, and swelling  If directed, put ice on the painful area. To do this: Put ice in a plastic bag, or use a frozen bottle of water. Place a towel between your skin and the bag or bottle. Roll the bottom of your foot over the bag or bottle. Do this for  20 minutes, 2-3 times a day. Wear athletic shoes that have air-sole or gel-sole cushions, or try soft shoe inserts that are designed for plantar fasciitis. Elevate your foot above the level of your heart while you are sitting or lying down.  Activity Avoid activities that cause pain. Ask your health care provider what activities are safe for you. Do physical therapy exercises and stretches as told by your health care provider. Try activities and forms of exercise that are easier on your joints (low impact). Examples include swimming, water aerobics, and biking. General instructions Take over-the-counter and prescription medicines only as told by your health care provider. Wear a night splint while sleeping, if told by your health care provider. Loosen the splint if your toes tingle, become numb, or turn cold and blue. Maintain a healthy weight, or work with your health care provider to lose weight as needed. Keep all follow-up visits. This is important. Contact a health care provider if you have: Symptoms that do not go away with home treatment. Pain that gets worse. Pain that affects your ability to move or do daily activities. Summary Plantar fasciitis is a painful foot condition that affects the heel. It occurs when the band of tissue that connects the toes to the heel bone (plantar fascia) becomes irritated. Heel pain is the main symptom of this condition. It may get worse after exercising too much or standing still for a long time. Treatment varies, but it usually starts with rest, ice, pressure (compression), and raising (elevating) the affected foot. This is called RICE therapy. Over-the-counter medicines can also be used to manage pain. This information is not intended to replace advice given to you by your health care provider. Make sure you discuss any questions you have with your healthcare provider. Document Revised: 01/24/2020 Document Reviewed: 01/24/2020 Elsevier Patient  Education  2022 [employer].

## 2021-04-12 ENCOUNTER — Encounter: Payer: Self-pay | Admitting: Dermatology

## 2021-04-12 NOTE — Progress Notes (Signed)
   Follow-Up Visit   Subjective  Brandon Bernard is a 70 y.o. male who presents for the following: Procedure (Bcc on back x1 ).  BCC back Location:  Duration:  Quality:  Associated Signs/Symptoms: Modifying Factors:  Severity:  Timing: Context:   Objective  Well appearing patient in no apparent distress; mood and affect are within normal limits. Right Upper Back Lesion identified by Dr.Joellen Tullos and nurse in room.      All skin waist up examined.   Assessment & Plan    Basal cell carcinoma (BCC), unspecified site Right Upper Back  Destruction of lesion Complexity: simple   Destruction method: electrodesiccation and curettage   Informed consent: discussed and consent obtained   Timeout:  patient name, date of birth, surgical site, and procedure verified Anesthesia: the lesion was anesthetized in a standard fashion   Anesthetic:  1% lidocaine w/ epinephrine 1-100,000 local infiltration Curettage performed in three different directions: Yes   Curettage cycles:  3 Lesion length (cm):  2 Lesion width (cm):  2 Margin per side (cm):  0 Final wound size (cm):  2 Hemostasis achieved with:  ferric subsulfate Outcome: patient tolerated procedure well with no complications   Additional details:  Wound innoculated with 5 fluorouracil solution.      I, Lavonna Monarch, MD, have reviewed all documentation for this visit.  The documentation on 04/12/21 for the exam, diagnosis, procedures, and orders are all accurate and complete.

## 2021-06-01 ENCOUNTER — Other Ambulatory Visit: Payer: Self-pay

## 2021-06-01 ENCOUNTER — Encounter: Payer: Self-pay | Admitting: Registered Nurse

## 2021-06-01 ENCOUNTER — Ambulatory Visit (INDEPENDENT_AMBULATORY_CARE_PROVIDER_SITE_OTHER): Payer: Medicare Other | Admitting: Registered Nurse

## 2021-06-01 VITALS — BP 129/80 | HR 76

## 2021-06-01 DIAGNOSIS — Z981 Arthrodesis status: Secondary | ICD-10-CM | POA: Diagnosis not present

## 2021-06-01 DIAGNOSIS — Z0000 Encounter for general adult medical examination without abnormal findings: Secondary | ICD-10-CM

## 2021-06-01 DIAGNOSIS — K7400 Hepatic fibrosis, unspecified: Secondary | ICD-10-CM | POA: Diagnosis not present

## 2021-06-01 DIAGNOSIS — S129XXS Fracture of neck, unspecified, sequela: Secondary | ICD-10-CM

## 2021-06-01 NOTE — Patient Instructions (Signed)
° ° ° °  If you have lab work done today you will be contacted with your lab results within the next 2 weeks.  If you have not heard from us then please contact us. The fastest way to get your results is to register for My Chart. ° ° °IF you received an x-ray today, you will receive an invoice from [facility]. Please contact [facility] at [phone] with questions or concerns regarding your invoice.  ° °IF you received labwork today, you will receive an invoice from [facility]. Please contact [facility] at [phone] with questions or concerns regarding your invoice.  ° °Our billing staff will not be able to assist you with questions regarding bills from these companies. ° °You will be contacted with the lab results as soon as they are available. The fastest way to get your results is to activate your My Chart account. Instructions are located on the last page of this paperwork. If you have not heard from us regarding the results in 2 weeks, please contact this office. °  ° ° ° °

## 2021-06-02 LAB — PSA: PSA: 3.31 ng/mL (ref <=4.00)

## 2021-06-02 LAB — CBC WITH DIFFERENTIAL/PLATELET
Absolute Monocytes: 554 cells/uL (ref 200–950)
Basophils Absolute: 40 cells/uL (ref 0–200)
Basophils Relative: 0.6 %
Eosinophils Absolute: 99 cells/uL (ref 15–500)
Eosinophils Relative: 1.5 %
HCT: 42.4 % (ref 38.5–50.0)
Hemoglobin: 14.7 g/dL (ref 13.2–17.1)
Lymphs Abs: 1228 cells/uL (ref 850–3900)
MCH: 33 pg (ref 27.0–33.0)
MCHC: 34.7 g/dL (ref 32.0–36.0)
MCV: 95.3 fL (ref 80.0–100.0)
MPV: 10.9 fL (ref 7.5–12.5)
Monocytes Relative: 8.4 %
Neutro Abs: 4679 cells/uL (ref 1500–7800)
Neutrophils Relative %: 70.9 %
Platelets: 252 10*3/uL (ref 140–400)
RBC: 4.45 10*6/uL (ref 4.20–5.80)
RDW: 12.9 % (ref 11.0–15.0)
Total Lymphocyte: 18.6 %
WBC: 6.6 10*3/uL (ref 3.8–10.8)

## 2021-06-02 LAB — LIPID PANEL
Cholesterol: 205 mg/dL — ABNORMAL HIGH (ref <200)
HDL: 86 mg/dL (ref >=40)
LDL Cholesterol (Calc): 101 mg/dL (calc) — ABNORMAL HIGH
Non-HDL Cholesterol (Calc): 119 mg/dL (calc) (ref <130)
Total CHOL/HDL Ratio: 2.4 (calc) (ref <5.0)
Triglycerides: 87 mg/dL (ref <150)

## 2021-06-02 LAB — HEMOGLOBIN A1C
Hgb A1c MFr Bld: 5.3 % of total Hgb (ref <5.7)
Mean Plasma Glucose: 105 mg/dL
eAG (mmol/L): 5.8 mmol/L

## 2021-06-02 LAB — COMPREHENSIVE METABOLIC PANEL
AG Ratio: 1.6 (calc) (ref 1.0–2.5)
ALT: 12 U/L (ref 9–46)
AST: 17 U/L (ref 10–35)
Albumin: 4.4 g/dL (ref 3.6–5.1)
Alkaline phosphatase (APISO): 59 U/L (ref 35–144)
BUN: 15 mg/dL (ref 7–25)
CO2: 28 mmol/L (ref 20–32)
Calcium: 9.6 mg/dL (ref 8.6–10.3)
Chloride: 106 mmol/L (ref 98–110)
Creat: 0.87 mg/dL (ref 0.70–1.35)
Globulin: 2.8 g/dL (calc) (ref 1.9–3.7)
Glucose, Bld: 82 mg/dL (ref 65–99)
Potassium: 4.3 mmol/L (ref 3.5–5.3)
Sodium: 142 mmol/L (ref 135–146)
Total Bilirubin: 0.6 mg/dL (ref 0.2–1.2)
Total Protein: 7.2 g/dL (ref 6.1–8.1)

## 2021-06-02 LAB — TSH: TSH: 0.78 mIU/L (ref 0.40–4.50)

## 2021-07-16 NOTE — Progress Notes (Signed)
Established Patient Office Visit  Subjective:  Patient ID: Brandon Bernard, male    DOB: [birth year]  Age: 70 y.o. MRN: [id number]  CC:  Chief Complaint  Patient presents with   Transitions Of Care    Patient states he will like a TOC and also lab work.    HPI Brandon Bernard presents for visit to est care  No acute concerns  Histories reviewed and updated with patient.    Past Medical History:  Diagnosis Date   Basal cell carcinoma 12/13/2020   sup- right upper back    Basal cell carcinoma 12/13/2020   sup & nod- left flank(EXC)   Hepatitis C 2019   treated in Spring 2019    History of chickenpox    Seasonal allergies    Wears glasses     Past Surgical History:  Procedure Laterality Date   EYE SURGERY Left 1996   Orbital Reconstruction   FRACTURE SURGERY  2014   Left ankle, Right knee, Right wrist, larynx, Lumbar vertebrae, cervical vertebra   HERNIA REPAIR  1994   TONSILLECTOMY     wisdom teeth ext      Family History  Problem Relation Age of Onset   Esophageal cancer Neg Hx    Inflammatory bowel disease Neg Hx    Liver disease Neg Hx    Pancreatic cancer Neg Hx    Stomach cancer Neg Hx    Rectal cancer Neg Hx    Colon cancer Neg Hx    Colon polyps Neg Hx     Social History   Socioeconomic History   Marital status: Married    Spouse name: Not on file   Number of children: Not on file   Years of education: Not on file   Highest education level: Not on file  Occupational History   Not on file  Tobacco Use   Smoking status: Never   Smokeless tobacco: Never  Vaping Use   Vaping Use: Never used  Substance and Sexual Activity   Alcohol use: Yes    Alcohol/week: 1.0 standard drink    Types: 1 Glasses of wine per week   Drug use: Never   Sexual activity: Yes    Partners: Female  Other Topics Concern   Not on file  Social History Narrative   Not on file   Social Determinants of Health   Financial Resource Strain: Low Risk    Difficulty  of Paying Living Expenses: Not hard at all  Food Insecurity: No Food Insecurity   Worried About Charity fundraiser in the Last Year: Never true   [place] in the Last Year: Never true  Transportation Needs: No Transportation Needs   Lack of Transportation (Medical): No   Lack of Transportation (Non-Medical): No  Physical Activity: Sufficiently Active   Days of Exercise per Week: 7 days   Minutes of Exercise per Session: 40 min  Stress: No Stress Concern Present   Feeling of Stress : Not at all  Social Connections: Moderately Integrated   Frequency of Communication with Friends and Family: More than three times a week   Frequency of Social Gatherings with Friends and Family: Once a week   Attends Religious Services: Never   Marine scientist or Organizations: Yes   Attends Archivist Meetings: 1 to 4 times per year   Marital Status: Married  Human resources officer Violence: Not At Risk   Fear of Current or Ex-Partner: No  Emotionally Abused: No   Physically Abused: No   Sexually Abused: No    Outpatient Medications Prior to Visit  Medication Sig Dispense Refill   Multiple Vitamin (MULTIVITAMIN) tablet Take 1 tablet by mouth daily.     No facility-administered medications prior to visit.    Allergies  Allergen Reactions   Minocycline Other (See Comments)    Candidiasis    ROS Review of Systems  Constitutional: Negative.   HENT: Negative.    Eyes: Negative.   Respiratory: Negative.    Cardiovascular: Negative.   Gastrointestinal: Negative.   Genitourinary: Negative.   Musculoskeletal: Negative.   Skin: Negative.   Neurological: Negative.   Psychiatric/Behavioral: Negative.    All other systems reviewed and are negative.    Objective:    Physical Exam Vitals and nursing note reviewed.  Constitutional:      General: He is not in acute distress.    Appearance: Normal appearance. He is normal weight. He is not ill-appearing, toxic-appearing or  diaphoretic.  HENT:     Head: Normocephalic and atraumatic.     Right Ear: Tympanic membrane, ear canal and external ear normal. There is no impacted cerumen.     Left Ear: Tympanic membrane, ear canal and external ear normal. There is no impacted cerumen.     Nose: Nose normal. No congestion or rhinorrhea.     Mouth/Throat:     Mouth: Mucous membranes are moist.     Pharynx: Oropharynx is clear. No oropharyngeal exudate or posterior oropharyngeal erythema.  Eyes:     General: No scleral icterus.       Right eye: No discharge.        Left eye: No discharge.     Extraocular Movements: Extraocular movements intact.     Conjunctiva/sclera: Conjunctivae normal.     Pupils: Pupils are equal, round, and reactive to light.  Neck:     Vascular: No carotid bruit.  Cardiovascular:     Rate and Rhythm: Normal rate and regular rhythm.     Pulses: Normal pulses.     Heart sounds: Normal heart sounds. No murmur heard.   No friction rub. No gallop.  Pulmonary:     Effort: Pulmonary effort is normal. No respiratory distress.     Breath sounds: Normal breath sounds. No stridor. No wheezing, rhonchi or rales.  Chest:     Chest wall: No tenderness.  Abdominal:     General: Abdomen is flat. Bowel sounds are normal. There is no distension.     Palpations: Abdomen is soft. There is no mass.     Tenderness: There is no abdominal tenderness. There is no right CVA tenderness, left CVA tenderness, guarding or rebound.     Hernia: No hernia is present.  Musculoskeletal:        General: No swelling, tenderness, deformity or signs of injury. Normal range of motion.     Cervical back: Normal range of motion and neck supple. No rigidity or tenderness.     Right lower leg: No edema.     Left lower leg: No edema.  Lymphadenopathy:     Cervical: No cervical adenopathy.  Skin:    General: Skin is warm and dry.     Capillary Refill: Capillary refill takes less than 2 seconds.     Coloration: Skin is not  jaundiced or pale.     Findings: No bruising, erythema, lesion or rash.  Neurological:     General: No focal deficit present.  Mental Status: He is alert and oriented to person, place, and time. Mental status is at baseline.     Cranial Nerves: No cranial nerve deficit.     Motor: No weakness.     Gait: Gait normal.  Psychiatric:        Mood and Affect: Mood normal.        Behavior: Behavior normal.        Thought Content: Thought content normal.        Judgment: Judgment normal.    BP 129/80   Pulse 76  Wt Readings from Last 3 Encounters:  04/11/21 169 lb 12.8 oz (77 kg)  08/09/20 167 lb (75.8 kg)  07/31/20 169 lb (76.7 kg)     Health Maintenance Due  Topic Date Due   Zoster Vaccines- Shingrix (1 of 2) Never done   COVID-19 Vaccine (4 - Booster for Pfizer series) 10/09/2020   INFLUENZA VACCINE  05/21/2021   Fecal DNA (Cologuard)  06/18/2021    There are no preventive care reminders to display for this patient.  Lab Results  Component Value Date   TSH 0.78 06/01/2021   Lab Results  Component Value Date   WBC 6.6 06/01/2021   HGB 14.7 06/01/2021   HCT 42.4 06/01/2021   MCV 95.3 06/01/2021   PLT 252 06/01/2021   Lab Results  Component Value Date   NA 142 06/01/2021   K 4.3 06/01/2021   CO2 28 06/01/2021   GLUCOSE 82 06/01/2021   BUN 15 06/01/2021   CREATININE 0.87 06/01/2021   BILITOT 0.6 06/01/2021   ALKPHOS 57 08/09/2020   AST 17 06/01/2021   ALT 12 06/01/2021   PROT 7.2 06/01/2021   ALBUMIN 4.6 08/09/2020   CALCIUM 9.6 06/01/2021   GFR 92.90 08/09/2020   Lab Results  Component Value Date   CHOL 205 (H) 06/01/2021   Lab Results  Component Value Date   HDL 86 06/01/2021   Lab Results  Component Value Date   LDLCALC 101 (H) 06/01/2021   Lab Results  Component Value Date   TRIG 87 06/01/2021   Lab Results  Component Value Date   CHOLHDL 2.4 06/01/2021   Lab Results  Component Value Date   HGBA1C 5.3 06/01/2021      Assessment &  Plan:   Problem List Items Addressed This Visit       Digestive   Liver fibrosis     Musculoskeletal and Integument   Multiple fractures of cervical spine, closed (HCC)     Other   S/P lumbar fusion   Other Visit Diagnoses     Routine adult health maintenance    -  Primary   Relevant Orders   Lipid panel (Completed)   CBC with Differential/Platelet (Completed)   Comprehensive metabolic panel (Completed)   TSH (Completed)   Hemoglobin A1c (Completed)   PSA (Completed)   CBC with Differential/Platelet   Comprehensive metabolic panel   Hemoglobin A1c   Lipid panel   TSH   PSA       No orders of the defined types were placed in this encounter.   Follow-up: No follow-ups on file.   PLAN No acute concerns on exam today Labs collected. Will follow up with the patient as warranted. PSA screening collected after mutual decision making with patient Return annually, or sooner based on labs Patient encouraged to call clinic with any questions, comments, or concerns.  [name], NP

## 2021-09-06 ENCOUNTER — Encounter: Payer: Self-pay | Admitting: Registered Nurse

## 2021-09-06 ENCOUNTER — Ambulatory Visit (INDEPENDENT_AMBULATORY_CARE_PROVIDER_SITE_OTHER): Payer: Medicare Other | Admitting: Registered Nurse

## 2021-09-06 ENCOUNTER — Other Ambulatory Visit: Payer: Self-pay

## 2021-09-06 VITALS — BP 145/58 | HR 63 | Temp 98.3°F | Resp 18 | Ht 63.0 in | Wt 172.0 lb

## 2021-09-06 DIAGNOSIS — L989 Disorder of the skin and subcutaneous tissue, unspecified: Secondary | ICD-10-CM | POA: Diagnosis not present

## 2021-09-06 NOTE — Patient Instructions (Addendum)
Brandon Bernard -   Let's see what derm has to say.  I'll be in touch soon!  Thanks,  [name]     If you have lab work done today you will be contacted with your lab results within the next 2 weeks.  If you have not heard from [place] then please contact us. The fastest way to get your results is to register for My Chart.   IF you received an x-ray today, you will receive an invoice from [facility]. Please contact [facility] at [phone] with questions or concerns regarding your invoice.   IF you received labwork today, you will receive an invoice from [facility]. Please contact [facility] at [phone] with questions or concerns regarding your invoice.   Our billing staff will not be able to assist you with questions regarding bills from these companies.  You will be contacted with the lab results as soon as they are available. The fastest way to get your results is to activate your My Chart account. Instructions are located on the last page of this paperwork. If you have not heard from [place] regarding the results in 2 weeks, please contact this office.

## 2021-09-06 NOTE — Progress Notes (Signed)
Established Patient Office Visit  Subjective:  Patient ID: Brandon Bernard, male    DOB: [birth year]  Age: 70 y.o. MRN: [id number]  CC:  Chief Complaint  Patient presents with   Elbow Pain    Patient states he is having some pain on the left elbow. Patient states he also have a bump that keeps growning.    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 July? Started out feeling like insect bite, but kept growing. Not inflamed or red. Last week, noted it's grown larger and more sensitive.  Small ulceration in center About 3cm across Raised above skin Some flaking No local adenopathy  Past Medical History:  Diagnosis Date   Basal cell carcinoma 12/13/2020   sup- right upper back    Basal cell carcinoma 12/13/2020   sup & nod- left flank(EXC)   Hepatitis C 2019   treated in Spring 2019    History of chickenpox    Seasonal allergies    Wears glasses     Past Surgical History:  Procedure Laterality Date   EYE SURGERY Left 1996   Orbital Reconstruction   FRACTURE SURGERY  2014   Left ankle, Right knee, Right wrist, larynx, Lumbar vertebrae, cervical vertebra   HERNIA REPAIR  1994   TONSILLECTOMY     wisdom teeth ext      Family History  Problem Relation Age of Onset   Esophageal cancer Neg Hx    Inflammatory bowel disease Neg Hx    Liver disease Neg Hx    Pancreatic cancer Neg Hx    Stomach cancer Neg Hx    Rectal cancer Neg Hx    Colon cancer Neg Hx    Colon polyps Neg Hx     Social History   Socioeconomic History   Marital status: Married    Spouse name: Not on file   Number of children: Not on file   Years of education: Not on file   Highest education level: Not on file  Occupational History   Not on file  Tobacco Use   Smoking status: Never   Smokeless tobacco: Never  Vaping Use   Vaping Use: Never used  Substance and Sexual Activity   Alcohol use: Yes    Alcohol/week: 1.0 standard drink    Types: 1 Glasses  of wine per week   Drug use: Never   Sexual activity: Yes    Partners: Female  Other Topics Concern   Not on file  Social History Narrative   Not on file   Social Determinants of Health   Financial Resource Strain: Not on file  Food Insecurity: Not on file  Transportation Needs: Not on file  Physical Activity: Not on file  Stress: Not on file  Social Connections: Not on file  Intimate Partner Violence: Not on file    Outpatient Medications Prior to Visit  Medication Sig Dispense Refill   Multiple Vitamin (MULTIVITAMIN) tablet Take 1 tablet by mouth daily.     No facility-administered medications prior to visit.    Allergies  Allergen Reactions   Minocycline Other (See Comments)    Candidiasis    ROS Review of Systems  Constitutional: Negative.   HENT: Negative.    Eyes: Negative.   Respiratory: Negative.    Cardiovascular: Negative.   Gastrointestinal: Negative.   Genitourinary: Negative.   Musculoskeletal: Negative.   Skin: Negative.   Neurological: Negative.   Psychiatric/Behavioral: Negative.  All other systems reviewed and are negative.    Objective:    Physical Exam Constitutional:      General: He is not in acute distress.    Appearance: Normal appearance. He is normal weight. He is not ill-appearing, toxic-appearing or diaphoretic.  Cardiovascular:     Rate and Rhythm: Normal rate and regular rhythm.     Heart sounds: Normal heart sounds. No murmur heard.   No friction rub. No gallop.  Pulmonary:     Effort: Pulmonary effort is normal. No respiratory distress.     Breath sounds: Normal breath sounds. No stridor. No wheezing, rhonchi or rales.  Chest:     Chest wall: No tenderness.  Skin:    Findings: Lesion (L dorsal upper arm appx 5cm above olecranon process. raised. firm. small ulceration in center. round. mildly tender. superficial.) present.  Neurological:     General: No focal deficit present.     Mental Status: He is alert and  oriented to person, place, and time. Mental status is at baseline.  Psychiatric:        Mood and Affect: Mood normal.        Behavior: Behavior normal.        Thought Content: Thought content normal.        Judgment: Judgment normal.        BP (!) 145/58   Pulse 63   Temp 98.3 F (36.8 C) (Temporal)   Resp 18   Ht 5\' 3"  (1.6 m)   Wt 172 lb (78 kg)   SpO2 99%   BMI 30.47 kg/m  Wt Readings from Last 3 Encounters:  09/06/21 172 lb (78 kg)  04/11/21 169 lb 12.8 oz (77 kg)  08/09/20 167 lb (75.8 kg)     Health Maintenance Due  Topic Date Due   Zoster Vaccines- Shingrix (1 of 2) Never done   COVID-19 Vaccine (4 - Booster for Pfizer series) 09/11/2020   INFLUENZA VACCINE  05/21/2021   Fecal DNA (Cologuard)  06/18/2021    There are no preventive care reminders to display for this patient.  Lab Results  Component Value Date   TSH 0.78 06/01/2021   Lab Results  Component Value Date   WBC 6.6 06/01/2021   HGB 14.7 06/01/2021   HCT 42.4 06/01/2021   MCV 95.3 06/01/2021   PLT 252 06/01/2021   Lab Results  Component Value Date   NA 142 06/01/2021   K 4.3 06/01/2021   CO2 28 06/01/2021   GLUCOSE 82 06/01/2021   BUN 15 06/01/2021   CREATININE 0.87 06/01/2021   BILITOT 0.6 06/01/2021   ALKPHOS 57 08/09/2020   AST 17 06/01/2021   ALT 12 06/01/2021   PROT 7.2 06/01/2021   ALBUMIN 4.6 08/09/2020   CALCIUM 9.6 06/01/2021   GFR 92.90 08/09/2020   Lab Results  Component Value Date   CHOL 205 (H) 06/01/2021   Lab Results  Component Value Date   HDL 86 06/01/2021   Lab Results  Component Value Date   LDLCALC 101 (H) 06/01/2021   Lab Results  Component Value Date   TRIG 87 06/01/2021   Lab Results  Component Value Date   CHOLHDL 2.4 06/01/2021   Lab Results  Component Value Date   HGBA1C 5.3 06/01/2021      Assessment & Plan:   Problem List Items Addressed This Visit   None Visit Diagnoses     Skin lesion of left arm    -  Primary  No orders of the defined types were placed in this encounter.   Follow-up: Return if symptoms worsen or fail to improve.   PLAN Uncertain of this presentation and relatively quick progression over the past few months. He has a hx of bcc and is est with Dr. Lavonna Monarch and [facility]. Will CC for their thoughts, consider getting him back in their office with any concerns. Patient encouraged to call clinic with any questions, comments, or concerns.  [name], NP

## 2021-09-07 ENCOUNTER — Encounter: Payer: Self-pay | Admitting: Registered Nurse

## 2021-09-18 ENCOUNTER — Ambulatory Visit: Payer: Medicare Other | Admitting: Dermatology

## 2021-09-18 ENCOUNTER — Encounter: Payer: Self-pay | Admitting: Dermatology

## 2021-09-18 ENCOUNTER — Other Ambulatory Visit: Payer: Self-pay | Admitting: Dermatology

## 2021-09-18 ENCOUNTER — Other Ambulatory Visit: Payer: Self-pay

## 2021-09-18 DIAGNOSIS — D485 Neoplasm of uncertain behavior of skin: Secondary | ICD-10-CM

## 2021-09-18 DIAGNOSIS — C44629 Squamous cell carcinoma of skin of left upper limb, including shoulder: Secondary | ICD-10-CM

## 2021-09-18 NOTE — Patient Instructions (Signed)

## 2021-10-06 ENCOUNTER — Encounter: Payer: Self-pay | Admitting: Dermatology

## 2021-10-06 NOTE — Progress Notes (Signed)
° °  Follow-Up Visit   Subjective  Brandon Bernard is a 70 y.o. male who presents for the following: Skin Problem (New growth on left elbow- growing quick).  New growth left arm Location:  Duration:  Quality:  Associated Signs/Symptoms: Modifying Factors:  Severity:  Timing: Context:   Objective  Well appearing patient in no apparent distress; mood and affect are within normal limits. Left Elbow - Posterior 7 mm volcano like pink nodule, SCCA versus inflamed wart         A focused examination was performed including head, neck, arms. Relevant physical exam findings are noted in the Assessment and Plan.   Assessment & Plan    Squamous cell carcinoma of arm, left Left Elbow - Posterior  Skin / nail biopsy Type of biopsy: tangential   Informed consent: discussed and consent obtained   Timeout: patient name, date of birth, surgical site, and procedure verified   Procedure prep:  Patient was prepped and draped in usual sterile fashion (Non sterile) Prep type:  Chlorhexidine Anesthesia: the lesion was anesthetized in a standard fashion   Anesthetic:  1% lidocaine w/ epinephrine 1-100,000 local infiltration Instrument used: flexible razor blade   Outcome: patient tolerated procedure well   Post-procedure details: wound care instructions given    Destruction of lesion Complexity: simple   Destruction method: electrodesiccation and curettage   Informed consent: discussed and consent obtained   Timeout:  patient name, date of birth, surgical site, and procedure verified Anesthesia: the lesion was anesthetized in a standard fashion   Anesthetic:  1% lidocaine w/ epinephrine 1-100,000 local infiltration Curettage performed in three different directions: Yes   Curettage cycles:  1 Lesion length (cm):  1.2 Lesion width (cm):  1.2 Margin per side (cm):  0 Final wound size (cm):  1.2 Hemostasis achieved with:  aluminum chloride Outcome: patient tolerated procedure well  with no complications   Post-procedure details: wound care instructions given    Specimen 1 - Surgical pathology Differential Diagnosis: bcc vs scc- txpbx  Check Margins: No  After shave biopsy the base of the lesion was curetted and cauterized.      I, Lavonna Monarch, MD, have reviewed all documentation for this visit.  The documentation on 10/06/21 for the exam, diagnosis, procedures, and orders are all accurate and complete.

## 2021-10-18 ENCOUNTER — Ambulatory Visit (INDEPENDENT_AMBULATORY_CARE_PROVIDER_SITE_OTHER): Payer: Medicare Other

## 2021-10-18 VITALS — BP 132/68 | HR 69 | Temp 98.2°F | Ht 63.0 in | Wt 176.0 lb

## 2021-10-18 DIAGNOSIS — Z0000 Encounter for general adult medical examination without abnormal findings: Secondary | ICD-10-CM

## 2021-10-18 NOTE — Progress Notes (Signed)
Subjective:   Brandon Bernard is a 70 y.o. male who presents for an Subsequent Medicare Annual Wellness Visit.  Review of Systems     Cardiac Risk Factors include: advanced age (>42mn, >>72women);male gender     Objective:    Today's Vitals   10/18/21 0901  BP: 132/68  Pulse: 69  Temp: 98.2 F (36.8 C)  SpO2: 97%  Weight: 176 lb (79.8 kg)  Height: 5' 3"  (1.6 m)   Body mass index is 31.18 kg/m.  Advanced Directives 10/18/2021 07/31/2020 07/26/2019 07/22/2018  Does Patient Have a Medical Advance Directive? Yes Yes Yes Yes  Type of AParamedicof [place] CommunityLiving will [place]Living will [place] will [place] will  Copy of HPeruin Chart? No - copy requested No - copy requested No - copy requested No - copy requested    Current Medications (verified) Outpatient Encounter Medications as of 10/18/2021  Medication Sig   Multiple Vitamin (MULTIVITAMIN) tablet Take 1 tablet by mouth daily.   No facility-administered encounter medications on file as of 10/18/2021.    Allergies (verified) Minocycline   History: Past Medical History:  Diagnosis Date   Basal cell carcinoma 12/13/2020   sup- right upper back    Basal cell carcinoma 12/13/2020   sup & nod- left flank(EXC)   Hepatitis C 2019   treated in Spring 2019    History of chickenpox    Seasonal allergies    Wears glasses    Past Surgical History:  Procedure Laterality Date   EYE SURGERY Left 1996   Orbital Reconstruction   FRACTURE SURGERY  2014   Left ankle, Right knee, Right wrist, larynx, Lumbar vertebrae, cervical vertebra   HERNIA REPAIR  1994   TONSILLECTOMY     wisdom teeth ext     Family History  Problem Relation Age of Onset   Esophageal cancer Neg Hx    Inflammatory bowel disease Neg Hx    Liver disease Neg Hx    Pancreatic cancer Neg Hx    Stomach cancer Neg Hx    Rectal  cancer Neg Hx    Colon cancer Neg Hx    Colon polyps Neg Hx    Social History   Socioeconomic History   Marital status: Married    Spouse name: Not on file   Number of children: Not on file   Years of education: Not on file   Highest education level: Not on file  Occupational History   Not on file  Tobacco Use   Smoking status: Never   Smokeless tobacco: Never  Vaping Use   Vaping Use: Never used  Substance and Sexual Activity   Alcohol use: Yes    Alcohol/week: 1.0 standard drink    Types: 1 Glasses of wine per week   Drug use: Never   Sexual activity: Yes    Partners: Female  Other Topics Concern   Not on file  Social History Narrative   Not on file   Social Determinants of Health   Financial Resource Strain: Low Risk    Difficulty of Paying Living Expenses: Not hard at all  Food Insecurity: No Food Insecurity   Worried About RCharity fundraiserin the Last Year: Never true   RBrainardin the Last Year: Never true  Transportation Needs: No Transportation Needs   Lack of Transportation (Medical): No   Lack of Transportation (Non-Medical): No  Physical Activity: Sufficiently Active   Days of Exercise per Week: 7 days   Minutes of Exercise per Session: 30 min  Stress: No Stress Concern Present   Feeling of Stress : Not at all  Social Connections: Moderately Integrated   Frequency of Communication with Friends and Family: Twice a week   Frequency of Social Gatherings with Friends and Family: Twice a week   Attends Religious Services: Never   Marine scientist or Organizations: Yes   Attends Music therapist: More than 4 times per year   Marital Status: Married    Tobacco Counseling Counseling given: Not Answered   Clinical Intake:  Pre-visit preparation completed: Yes  Pain : No/denies pain     Nutritional Risks: None Diabetes: No  How often do you need to have someone help you when you read instructions, pamphlets, or other  written materials from your doctor or pharmacy?: 1 - Never What is the last grade level you completed in school?: college  Diabetic?no   Interpreter Needed?: No  Information entered by :: [place] of Daily Living In your present state of health, do you have any difficulty performing the following activities: 10/18/2021 10/17/2021  Hearing? N N  Vision? N N  Difficulty concentrating or making decisions? N N  Walking or climbing stairs? N N  Dressing or bathing? N N  Doing errands, shopping? N N  Preparing Food and eating ? N N  Using the Toilet? N N  In the past six months, have you accidently leaked urine? N N  Do you have problems with loss of bowel control? N N  Managing your Medications? N N  Managing your Finances? N N  Housekeeping or managing your Housekeeping? N N  Some recent data might be hidden    Patient Care Team: [name], NP as PCP - General (Adult Health Nurse Practitioner) Mansouraty, Telford Nab., MD as Consulting Physician (Gastroenterology) Allyn Kenner, MD (Dermatology) Comer, Okey Regal, MD as Consulting Physician (Infectious Diseases) Henreitta Leber (Dentistry) Warren Danes, PA-C as Physician Assistant (Dermatology) Lavonna Monarch, MD as Consulting Physician (Dermatology)  Indicate any recent Medical Services you may have received from other than [facility] providers in the past year (date may be approximate).     Assessment:   This is a routine wellness examination for Brandon Bernard.  Hearing/Vision screen Vision Screening - Comments:: Annual eye exams wear glasses   Dietary issues and exercise activities discussed: Current Exercise Habits: Home exercise routine, Type of exercise: walking, Time (Minutes): 30, Frequency (Times/Week): 5, Weekly Exercise (Minutes/Week): 150, Intensity: Mild, Exercise limited by: None identified   Goals Addressed             This Visit's Progress    Patient Stated   On track    Maintain current  health.        Depression Screen PHQ 2/9 Scores 10/18/2021 10/18/2021 06/01/2021 04/11/2021 08/09/2020 07/26/2019 07/22/2018  PHQ - 2 Score 0 0 0 0 0 0 0  PHQ- 9 Score - - 0 0 - - -    Fall Risk Fall Risk  10/18/2021 10/17/2021 06/01/2021 04/11/2021 07/31/2020  Falls in the past year? 0 0 0 0 0  Number falls in past yr: 0 0 0 - 0  Injury with Fall? 0 0 0 - 0  Risk for fall due to : - - No Fall Risks - -  Follow up Falls evaluation completed - Falls evaluation completed Falls evaluation completed Falls prevention  discussed    FALL RISK PREVENTION PERTAINING TO THE HOME:  Any stairs in or around the home? Yes  If so, are there any without handrails? No  Home free of loose throw rugs in walkways, pet beds, electrical cords, etc? Yes  Adequate lighting in your home to reduce risk of falls? Yes   ASSISTIVE DEVICES UTILIZED TO PREVENT FALLS:  Life alert? No  Use of a cane, walker or w/c? No  Grab bars in the bathroom? No  Shower chair or bench in shower? No  Elevated toilet seat or a handicapped toilet? No   TIMED UP AND GO:  Was the test performed? Yes .  Length of time to ambulate 10 feet: 6 sec.   Gait steady and fast without use of assistive device  Cognitive Function: Normal cognitive status assessed by direct observation by this Nurse Health Advisor. No abnormalities found.   MMSE - Mini Mental State Exam 07/26/2019 07/22/2018  Orientation to time 5 5  Orientation to Place 5 5  Registration 3 3  Attention/ Calculation 5 5  Recall 3 3  Language- name 2 objects 2 2  Language- repeat 1 1  Language- follow 3 step command 3 3  Language- read & follow direction 1 1  Write a sentence 1 1  Copy design 1 1  Total score 30 30        Immunizations Immunization History  Administered Date(s) Administered   Fluad Quad(high Dose 65+) 08/09/2020   PFIZER(Purple Top)SARS-COV-2 Vaccination 12/28/2019, 01/18/2020, 07/17/2020   Pneumococcal Conjugate-13 07/26/2019    Pneumococcal Polysaccharide-23 10/30/2017   Tdap 10/22/2011    TDAP status: Up to date  Flu Vaccine status: Up to date  Pneumococcal vaccine status: Up to date  Covid-19 vaccine status: Completed vaccines  Qualifies for Shingles Vaccine? Yes   Zostavax completed No   Shingrix Completed?: No.    Education has been provided regarding the importance of this vaccine. Patient has been advised to call insurance company to determine out of pocket expense if they have not yet received this vaccine. Advised may also receive vaccine at local pharmacy or Health Dept. Verbalized acceptance and understanding.  Screening Tests Health Maintenance  Topic Date Due   Zoster Vaccines- Shingrix (1 of 2) Never done   COVID-19 Vaccine (4 - Booster for Pfizer series) 09/11/2020   INFLUENZA VACCINE  05/21/2021   Fecal DNA (Cologuard)  06/18/2021   TETANUS/TDAP  10/21/2021   Pneumonia Vaccine 23+ Years old  Completed   Hepatitis C Screening  Completed   HPV VACCINES  Aged Out    Health Maintenance  Health Maintenance Due  Topic Date Due   Zoster Vaccines- Shingrix (1 of 2) Never done   COVID-19 Vaccine (4 - Booster for Pfizer series) 09/11/2020   INFLUENZA VACCINE  05/21/2021   Fecal DNA (Cologuard)  06/18/2021    Colorectal cancer screening: Referral to GI placed [name] to send order to mail kit to patient . Pt aware the office will call re: appt.  Lung Cancer Screening: (Low Dose CT Chest recommended if Age 14-80 years, 30 pack-year currently smoking OR have quit w/in 15years.) does not qualify.   Lung Cancer Screening Referral: n/a  Additional Screening:  Hepatitis C Screening: does not qualify; Completed 09/*28/2021  Vision Screening: Recommended annual ophthalmology exams for early detection of glaucoma and other disorders of the eye. Is the patient up to date with their annual eye exam?  Yes  Who is the provider or what is  the name of the office in which the patient attends  annual eye exams? [facility]  If pt is not established with a provider, would they like to be referred to a provider to establish care? No .   Dental Screening: Recommended annual dental exams for proper oral hygiene  Community Resource Referral / Chronic Care Management: CRR required this visit?  No   CCM required this visit?  No      Plan:     I have personally reviewed and noted the following in the patients chart:   Medical and social history Use of alcohol, tobacco or illicit drugs  Current medications and supplements including opioid prescriptions. Patient is not currently taking opioid prescriptions. Functional ability and status Nutritional status Physical activity Advanced directives List of other physicians Hospitalizations, surgeries, and ER visits in previous 12 months Vitals Screenings to include cognitive, depression, and falls Referrals and appointments  In addition, I have reviewed and discussed with patient certain preventive protocols, quality metrics, and best practice recommendations. A written personalized care plan for preventive services as well as general preventive health recommendations were provided to patient.     Randel Pigg, LPN   [id number]   Nurse Notes: none

## 2021-10-18 NOTE — Patient Instructions (Signed)
[name] , Thank you for taking time to come for your Medicare Wellness Visit. I appreciate your ongoing commitment to your health goals. Please review the following plan we discussed and let me know if I can assist you in the future.   Screening recommendations/referrals: Colonoscopy: Cologuard to mailed to Patient  Recommended yearly ophthalmology/optometry visit for glaucoma screening and checkup Recommended yearly dental visit for hygiene and checkup  Vaccinations: Influenza vaccine: completed  Pneumococcal vaccine: completed  Tdap vaccine: 10/22/2011 Shingles vaccine: will consider     Advanced directives: will provide copies   Conditions/risks identified: none   Next appointment: 10/24/2021  230pm  [name] , NP   Preventive Care 32 Years and Older, Male Preventive care refers to lifestyle choices and visits with your health care provider that can promote health and wellness. What does preventive care include? A yearly physical exam. This is also called an annual well check. Dental exams once or twice a year. Routine eye exams. Ask your health care provider how often you should have your eyes checked. Personal lifestyle choices, including: Daily care of your teeth and gums. Regular physical activity. Eating a healthy diet. Avoiding tobacco and drug use. Limiting alcohol use. Practicing safe sex. Taking low doses of aspirin every day. Taking vitamin and mineral supplements as recommended by your health care provider. What happens during an annual well check? The services and screenings done by your health care provider during your annual well check will depend on your age, overall health, lifestyle risk factors, and family history of disease. Counseling  Your health care provider may ask you questions about your: Alcohol use. Tobacco use. Drug use. Emotional well-being. Home and relationship well-being. Sexual activity. Eating habits. History of falls. Memory  and ability to understand (cognition). Work and work Statistician. Screening  You may have the following tests or measurements: Height, weight, and BMI. Blood pressure. Lipid and cholesterol levels. These may be checked every 5 years, or more frequently if you are over 39 years old. Skin check. Lung cancer screening. You may have this screening every year starting at age 43 if you have a 30-pack-year history of smoking and currently smoke or have quit within the past 15 years. Fecal occult blood test (FOBT) of the stool. You may have this test every year starting at age 6. Flexible sigmoidoscopy or colonoscopy. You may have a sigmoidoscopy every 5 years or a colonoscopy every 10 years starting at age 62. Prostate cancer screening. Recommendations will vary depending on your family history and other risks. Hepatitis C blood test. Hepatitis B blood test. Sexually transmitted disease (STD) testing. Diabetes screening. This is done by checking your blood sugar (glucose) after you have not eaten for a while (fasting). You may have this done every 1-3 years. Abdominal aortic aneurysm (AAA) screening. You may need this if you are a current or former smoker. Osteoporosis. You may be screened starting at age 93 if you are at high risk. Talk with your health care provider about your test results, treatment options, and if necessary, the need for more tests. Vaccines  Your health care provider may recommend certain vaccines, such as: Influenza vaccine. This is recommended every year. Tetanus, diphtheria, and acellular pertussis (Tdap, Td) vaccine. You may need a Td booster every 10 years. Zoster vaccine. You may need this after age 53. Pneumococcal 13-valent conjugate (PCV13) vaccine. One dose is recommended after age 56. Pneumococcal polysaccharide (PPSV23) vaccine. One dose is recommended after age 74. Talk to your  health care provider about which screenings and vaccines you need and how often you  need them. This information is not intended to replace advice given to you by your health care provider. Make sure you discuss any questions you have with your health care provider. Document Released: 11/03/2015 Document Revised: 06/26/2016 Document Reviewed: 08/08/2015 Elsevier Interactive Patient Education  2017 [place] Prevention in the Home Falls can cause injuries. They can happen to people of all ages. There are many things you can do to make your home safe and to help prevent falls. What can I do on the outside of my home? Regularly fix the edges of walkways and driveways and fix any cracks. Remove anything that might make you trip as you walk through a door, such as a raised step or threshold. Trim any bushes or trees on the path to your home. Use bright outdoor lighting. Clear any walking paths of anything that might make someone trip, such as rocks or tools. Regularly check to see if handrails are loose or broken. Make sure that both sides of any steps have handrails. Any raised decks and porches should have guardrails on the edges. Have any leaves, snow, or ice cleared regularly. Use sand or salt on walking paths during winter. Clean up any spills in your garage right away. This includes oil or grease spills. What can I do in the bathroom? Use night lights. Install grab bars by the toilet and in the tub and shower. Do not use towel bars as grab bars. Use non-skid mats or decals in the tub or shower. If you need to sit down in the shower, use a plastic, non-slip stool. Keep the floor dry. Clean up any water that spills on the floor as soon as it happens. Remove soap buildup in the tub or shower regularly. Attach bath mats securely with double-sided non-slip rug tape. Do not have throw rugs and other things on the floor that can make you trip. What can I do in the bedroom? Use night lights. Make sure that you have a light by your bed that is easy to reach. Do not  use any sheets or blankets that are too big for your bed. They should not hang down onto the floor. Have a firm chair that has side arms. You can use this for support while you get dressed. Do not have throw rugs and other things on the floor that can make you trip. What can I do in the kitchen? Clean up any spills right away. Avoid walking on wet floors. Keep items that you use a lot in easy-to-reach places. If you need to reach something above you, use a strong step stool that has a grab bar. Keep electrical cords out of the way. Do not use floor polish or wax that makes floors slippery. If you must use wax, use non-skid floor wax. Do not have throw rugs and other things on the floor that can make you trip. What can I do with my stairs? Do not leave any items on the stairs. Make sure that there are handrails on both sides of the stairs and use them. Fix handrails that are broken or loose. Make sure that handrails are as long as the stairways. Check any carpeting to make sure that it is firmly attached to the stairs. Fix any carpet that is loose or worn. Avoid having throw rugs at the top or bottom of the stairs. If you do have throw rugs, attach them  to the floor with carpet tape. Make sure that you have a light switch at the top of the stairs and the bottom of the stairs. If you do not have them, ask someone to add them for you. What else can I do to help prevent falls? Wear shoes that: Do not have high heels. Have rubber bottoms. Are comfortable and fit you well. Are closed at the toe. Do not wear sandals. If you use a stepladder: Make sure that it is fully opened. Do not climb a closed stepladder. Make sure that both sides of the stepladder are locked into place. Ask someone to hold it for you, if possible. Clearly mark and make sure that you can see: Any grab bars or handrails. First and last steps. Where the edge of each step is. Use tools that help you move around (mobility  aids) if they are needed. These include: Canes. Walkers. Scooters. Crutches. Turn on the lights when you go into a dark area. Replace any light bulbs as soon as they burn out. Set up your furniture so you have a clear path. Avoid moving your furniture around. If any of your floors are uneven, fix them. If there are any pets around you, be aware of where they are. Review your medicines with your doctor. Some medicines can make you feel dizzy. This can increase your chance of falling. Ask your doctor what other things that you can do to help prevent falls. This information is not intended to replace advice given to you by your health care provider. Make sure you discuss any questions you have with your health care provider. Document Released: 08/03/2009 Document Revised: 03/14/2016 Document Reviewed: 11/11/2014 Elsevier Interactive Patient Education  2017 [employer].

## 2021-10-24 ENCOUNTER — Encounter: Payer: Self-pay | Admitting: Registered Nurse

## 2021-10-24 ENCOUNTER — Ambulatory Visit (INDEPENDENT_AMBULATORY_CARE_PROVIDER_SITE_OTHER): Payer: Medicare Other | Admitting: Registered Nurse

## 2021-10-24 ENCOUNTER — Other Ambulatory Visit: Payer: Self-pay

## 2021-10-24 VITALS — BP 147/78 | HR 76 | Temp 98.3°F | Resp 18 | Ht 63.0 in | Wt 174.0 lb

## 2021-10-24 DIAGNOSIS — Z1211 Encounter for screening for malignant neoplasm of colon: Secondary | ICD-10-CM | POA: Diagnosis not present

## 2021-10-24 DIAGNOSIS — K7400 Hepatic fibrosis, unspecified: Secondary | ICD-10-CM | POA: Diagnosis not present

## 2021-10-24 NOTE — Patient Instructions (Addendum)
°  Mr Aikeem Lilley to see you  I've ordered imaging and cologuard. I'll follow up when results are available.  Thank you  Rich    If you have lab work done today you will be contacted with your lab results within the next 2 weeks.  If you have not heard from [place] then please contact us. The fastest way to get your results is to register for My Chart.   IF you received an x-ray today, you will receive an invoice from [facility]. Please contact [facility] at [phone] with questions or concerns regarding your invoice.   IF you received labwork today, you will receive an invoice from [facility]. Please contact [facility] at [phone] with questions or concerns regarding your invoice.   Our billing staff will not be able to assist you with questions regarding bills from these companies.  You will be contacted with the lab results as soon as they are available. The fastest way to get your results is to activate your My Chart account. Instructions are located on the last page of this paperwork. If you have not heard from [place] regarding the results in 2 weeks, please contact this office.

## 2021-10-24 NOTE — Progress Notes (Signed)
Established Patient Office Visit  Subjective:  Patient ID: Brandon Bernard, male    DOB: 1951-09-29  Age: 71 y.o. MRN: [id number]  CC:  Chief Complaint  Patient presents with   Follow-up    Patient states he is here for follow up on liver functions and to get a Cologuard .    HPI Brandon Bernard presents for follow up on LFT  Back in 2018 - Hep C routine screening - came back positive. Unexpected. Had been seen by [facility] at that time.  Saw Dr. Linus Salmons for treatment of this - resolved.  Referred to Dr. Rush Landmark for management of liver fibrosis.   Has had stable US liver and elastography since. Tries to follow up annually- busy year in 2022 and has not had imaging done since Sept 2023. Doing well, no symptoms of note.   Interested in getting an [place] w elastography done given hx of fibrosis of the liver.   Colon ca screening Due - had cologuard in past, would like to repeat No symptoms, no fam hx of colon ca.  Lab Results  Component Value Date   ALT 12 06/01/2021   AST 17 06/01/2021   ALKPHOS 57 08/09/2020   BILITOT 0.6 06/01/2021     Past Medical History:  Diagnosis Date   Basal cell carcinoma 12/13/2020   sup- right upper back    Basal cell carcinoma 12/13/2020   sup & nod- left flank(EXC)   Hepatitis C 2019   treated in Spring 2019    History of chickenpox    Seasonal allergies    Wears glasses     Past Surgical History:  Procedure Laterality Date   EYE SURGERY Left 1996   Orbital Reconstruction   FRACTURE SURGERY  2014   Left ankle, Right knee, Right wrist, larynx, Lumbar vertebrae, cervical vertebra   HERNIA REPAIR  1994   TONSILLECTOMY     wisdom teeth ext      Family History  Problem Relation Age of Onset   Esophageal cancer Neg Hx    Inflammatory bowel disease Neg Hx    Liver disease Neg Hx    Pancreatic cancer Neg Hx    Stomach cancer Neg Hx    Rectal cancer Neg Hx    Colon cancer Neg Hx    Colon polyps Neg Hx     Social History    Socioeconomic History   Marital status: Married    Spouse name: Not on file   Number of children: Not on file   Years of education: Not on file   Highest education level: Not on file  Occupational History   Not on file  Tobacco Use   Smoking status: Never   Smokeless tobacco: Never  Vaping Use   Vaping Use: Never used  Substance and Sexual Activity   Alcohol use: Yes    Alcohol/week: 1.0 standard drink    Types: 1 Glasses of wine per week   Drug use: Never   Sexual activity: Yes    Partners: Female  Other Topics Concern   Not on file  Social History Narrative   Not on file   Social Determinants of Health   Financial Resource Strain: Low Risk    Difficulty of Paying Living Expenses: Not hard at all  Food Insecurity: No Food Insecurity   Worried About Charity fundraiser in the Last Year: Never true   [place] in the Last Year: Never true  Transportation Needs: No  Transportation Needs   Lack of Transportation (Medical): No   Lack of Transportation (Non-Medical): No  Physical Activity: Sufficiently Active   Days of Exercise per Week: 7 days   Minutes of Exercise per Session: 30 min  Stress: No Stress Concern Present   Feeling of Stress : Not at all  Social Connections: Moderately Integrated   Frequency of Communication with Friends and Family: Twice a week   Frequency of Social Gatherings with Friends and Family: Twice a week   Attends Religious Services: Never   Marine scientist or Organizations: Yes   Attends Music therapist: More than 4 times per year   Marital Status: Married  Human resources officer Violence: Not At Risk   Fear of Current or Ex-Partner: No   Emotionally Abused: No   Physically Abused: No   Sexually Abused: No    Outpatient Medications Prior to Visit  Medication Sig Dispense Refill   Multiple Vitamin (MULTIVITAMIN) tablet Take 1 tablet by mouth daily.     No facility-administered medications prior to visit.     Allergies  Allergen Reactions   Minocycline Other (See Comments)    Candidiasis    ROS Review of Systems  Constitutional: Negative.   HENT: Negative.    Eyes: Negative.   Respiratory: Negative.    Cardiovascular: Negative.   Gastrointestinal: Negative.   Genitourinary: Negative.   Musculoskeletal: Negative.   Skin: Negative.   Neurological: Negative.   Psychiatric/Behavioral: Negative.    All other systems reviewed and are negative.    Objective:    Physical Exam Constitutional:      General: He is not in acute distress.    Appearance: Normal appearance. He is normal weight. He is not ill-appearing, toxic-appearing or diaphoretic.  Cardiovascular:     Rate and Rhythm: Normal rate and regular rhythm.     Heart sounds: Normal heart sounds. No murmur heard.   No friction rub. No gallop.  Pulmonary:     Effort: Pulmonary effort is normal. No respiratory distress.     Breath sounds: Normal breath sounds. No stridor. No wheezing, rhonchi or rales.  Chest:     Chest wall: No tenderness.  Neurological:     General: No focal deficit present.     Mental Status: He is alert and oriented to person, place, and time. Mental status is at baseline.  Psychiatric:        Mood and Affect: Mood normal.        Behavior: Behavior normal.        Thought Content: Thought content normal.        Judgment: Judgment normal.    BP (!) 147/78    Pulse 76    Temp 98.3 F (36.8 C) (Temporal)    Resp 18    Ht 5\' 3"  (1.6 m)    Wt 174 lb (78.9 kg)    BMI 30.82 kg/m  Wt Readings from Last 3 Encounters:  10/24/21 174 lb (78.9 kg)  10/18/21 176 lb (79.8 kg)  09/06/21 172 lb (78 kg)     Health Maintenance Due  Topic Date Due   Zoster Vaccines- Shingrix (1 of 2) Never done   Fecal DNA (Cologuard)  06/18/2021   COVID-19 Vaccine (6 - Booster for Pfizer series) 09/19/2021   TETANUS/TDAP  10/21/2021    There are no preventive care reminders to display for this patient.  Lab Results   Component Value Date   TSH 0.78 06/01/2021   Lab Results  Component Value Date   WBC 6.6 06/01/2021   HGB 14.7 06/01/2021   HCT 42.4 06/01/2021   MCV 95.3 06/01/2021   PLT 252 06/01/2021   Lab Results  Component Value Date   NA 142 06/01/2021   K 4.3 06/01/2021   CO2 28 06/01/2021   GLUCOSE 82 06/01/2021   BUN 15 06/01/2021   CREATININE 0.87 06/01/2021   BILITOT 0.6 06/01/2021   ALKPHOS 57 08/09/2020   AST 17 06/01/2021   ALT 12 06/01/2021   PROT 7.2 06/01/2021   ALBUMIN 4.6 08/09/2020   CALCIUM 9.6 06/01/2021   GFR 92.90 08/09/2020   Lab Results  Component Value Date   CHOL 205 (H) 06/01/2021   Lab Results  Component Value Date   HDL 86 06/01/2021   Lab Results  Component Value Date   LDLCALC 101 (H) 06/01/2021   Lab Results  Component Value Date   TRIG 87 06/01/2021   Lab Results  Component Value Date   CHOLHDL 2.4 06/01/2021   Lab Results  Component Value Date   HGBA1C 5.3 06/01/2021      Assessment & Plan:   Problem List Items Addressed This Visit       Digestive   Liver fibrosis   Relevant Orders   [place] ELASTOGRAPHY LIVER   Other Visit Diagnoses     Colon cancer screening    -  Primary   Relevant Orders   Cologuard       No orders of the defined types were placed in this encounter.   Follow-up: Return if symptoms worsen or fail to improve.   PLAN Cologuard sent Order US liver w elastography. Follow up as warranted. Will cc Dr. Rush Landmark on the results. Patient encouraged to call clinic with any questions, comments, or concerns.  [name], NP

## 2021-11-13 ENCOUNTER — Encounter: Payer: Self-pay | Admitting: Registered Nurse

## 2021-11-13 ENCOUNTER — Other Ambulatory Visit: Payer: Self-pay

## 2021-11-13 ENCOUNTER — Ambulatory Visit
Admission: RE | Admit: 2021-11-13 | Discharge: 2021-11-13 | Disposition: A | Discharge status: Home / Self Care | Payer: Medicare Other | Source: Ambulatory Visit | Attending: Registered Nurse | Admitting: Registered Nurse

## 2021-11-13 DIAGNOSIS — K7400 Hepatic fibrosis, unspecified: Secondary | ICD-10-CM

## 2021-11-14 LAB — COLOGUARD: COLOGUARD: NEGATIVE

## 2021-12-04 ENCOUNTER — Other Ambulatory Visit: Payer: Self-pay

## 2021-12-04 ENCOUNTER — Encounter: Payer: Self-pay | Admitting: Physician Assistant

## 2021-12-04 ENCOUNTER — Ambulatory Visit: Payer: Medicare Other | Admitting: Physician Assistant

## 2021-12-04 DIAGNOSIS — L219 Seborrheic dermatitis, unspecified: Secondary | ICD-10-CM

## 2021-12-04 DIAGNOSIS — L304 Erythema intertrigo: Secondary | ICD-10-CM

## 2021-12-04 MED ORDER — KETOCONAZOLE 2 % EX CREA: 1.0000 "application " | TOPICAL_CREAM | Freq: Two times a day (BID) | CUTANEOUS | 3 refills | Status: AC

## 2021-12-04 MED ORDER — ALCLOMETASONE DIPROPIONATE 0.05 % EX CREA: TOPICAL_CREAM | Freq: Two times a day (BID) | CUTANEOUS | 3 refills | Status: DC | PRN

## 2021-12-23 NOTE — Progress Notes (Signed)
° °  Follow-Up Visit   Subjective  Brandon Bernard is a 71 y.o. male who presents for the following: Annual Exam (Lesion under both breast x 4 months, itching no bleeding. Lesion on stomach and back x 4 months- itching. Personal history of bcc ).   The following portions of the chart were reviewed this encounter and updated as appropriate:  Tobacco   Allergies   Meds   Problems   Med Hx   Surg Hx   Fam Hx       Objective  Well appearing patient in no apparent distress; mood and affect are within normal limits.  A full examination was performed including scalp, head, eyes, ears, nose, lips, neck, chest, axillae, abdomen, back, buttocks, bilateral upper extremities, bilateral lower extremities, hands, feet, fingers, toes, fingernails, and toenails. All findings within normal limits unless otherwise noted below.  Left Abdomen (side) - Upper, Right Abdomen (side) - Upper Numerous crusts and erythema in the fold.  Head - Anterior (Face), Left Ear, Right Ear Erythematous plaques with scale.    Assessment & Plan  Erythema intertrigo Left Abdomen (side) - Upper; Right Abdomen (side) - Upper  alclomethasone (ACLOVATE) 0.05 % cream - Left Abdomen (side) - Upper, Right Abdomen (side) - Upper Apply topically 2 (two) times daily as needed (Rash).  ketoconazole (NIZORAL) 2 % cream - Left Abdomen (side) - Upper, Right Abdomen (side) - Upper Apply 1 application topically 2 (two) times daily.  Seborrheic dermatitis Head - Anterior (Face); Left Ear; Right Ear  alclomethasone (ACLOVATE) 0.05 % cream - Head - Anterior (Face), Left Ear, Right Ear Apply topically 2 (two) times daily as needed (Rash).  ketoconazole (NIZORAL) 2 % cream - Head - Anterior (Face), Left Ear, Right Ear Apply 1 application topically 2 (two) times daily.   No atypical nevi noted at the time of the visit. I, Sharla Tankard, PA-C, have reviewed all documentation's for this visit.  The documentation on 12/23/21 for the exam,  diagnosis, procedures and orders are all accurate and complete.

## 2022-01-13 ENCOUNTER — Encounter: Payer: Self-pay | Admitting: Registered Nurse

## 2022-01-20 ENCOUNTER — Encounter: Payer: Self-pay | Admitting: Registered Nurse

## 2022-06-07 ENCOUNTER — Encounter: Payer: Self-pay | Admitting: Family Medicine

## 2022-06-07 ENCOUNTER — Ambulatory Visit (INDEPENDENT_AMBULATORY_CARE_PROVIDER_SITE_OTHER): Payer: Medicare Other | Admitting: Family Medicine

## 2022-06-07 VITALS — BP 124/80 | HR 68 | Temp 97.3°F | Resp 16 | Ht 63.0 in | Wt 170.5 lb

## 2022-06-07 DIAGNOSIS — E669 Obesity, unspecified: Secondary | ICD-10-CM | POA: Diagnosis not present

## 2022-06-07 DIAGNOSIS — Z125 Encounter for screening for malignant neoplasm of prostate: Secondary | ICD-10-CM | POA: Diagnosis not present

## 2022-06-07 DIAGNOSIS — Z0000 Encounter for general adult medical examination without abnormal findings: Secondary | ICD-10-CM

## 2022-06-07 LAB — BASIC METABOLIC PANEL
BUN: 13 mg/dL (ref 6–23)
CO2: 30 mEq/L (ref 19–32)
Calcium: 9.6 mg/dL (ref 8.4–10.5)
Chloride: 102 mEq/L (ref 96–112)
Creatinine, Ser: 0.87 mg/dL (ref 0.40–1.50)
GFR: 87.52 mL/min (ref >60.00)
Glucose, Bld: 108 mg/dL — ABNORMAL HIGH (ref 70–99)
Potassium: 4.7 mEq/L (ref 3.5–5.1)
Sodium: 141 mEq/L (ref 135–145)

## 2022-06-07 LAB — LIPID PANEL
Cholesterol: 208 mg/dL — ABNORMAL HIGH (ref 0–200)
HDL: 87.3 mg/dL (ref >39.00)
LDL Cholesterol: 107 mg/dL — ABNORMAL HIGH (ref 0–99)
NonHDL: 120.35
Total CHOL/HDL Ratio: 2
Triglycerides: 65 mg/dL (ref 0.0–149.0)
VLDL: 13 mg/dL (ref 0.0–40.0)

## 2022-06-07 LAB — PSA, MEDICARE: PSA: 2.58 ng/ml (ref 0.10–4.00)

## 2022-06-07 LAB — CBC WITH DIFFERENTIAL/PLATELET
Basophils Absolute: 0 10*3/uL (ref 0.0–0.1)
Basophils Relative: 0.7 % (ref 0.0–3.0)
Eosinophils Absolute: 0.2 10*3/uL (ref 0.0–0.7)
Eosinophils Relative: 3.4 % (ref 0.0–5.0)
HCT: 44.2 % (ref 39.0–52.0)
Hemoglobin: 14.8 g/dL (ref 13.0–17.0)
Lymphocytes Relative: 26.1 % (ref 12.0–46.0)
Lymphs Abs: 1.3 10*3/uL (ref 0.7–4.0)
MCHC: 33.5 g/dL (ref 30.0–36.0)
MCV: 97.9 fl (ref 78.0–100.0)
Monocytes Absolute: 0.5 10*3/uL (ref 0.1–1.0)
Monocytes Relative: 10 % (ref 3.0–12.0)
Neutro Abs: 3 10*3/uL (ref 1.4–7.7)
Neutrophils Relative %: 59.8 % (ref 43.0–77.0)
Platelets: 218 10*3/uL (ref 150.0–400.0)
RBC: 4.52 Mil/uL (ref 4.22–5.81)
RDW: 13.9 % (ref 11.5–15.5)
WBC: 5 10*3/uL (ref 4.0–10.5)

## 2022-06-07 LAB — TSH: TSH: 0.84 u[IU]/mL (ref 0.35–5.50)

## 2022-06-07 LAB — HEPATIC FUNCTION PANEL
ALT: 13 U/L (ref 0–53)
AST: 19 U/L (ref 0–37)
Albumin: 4.6 g/dL (ref 3.5–5.2)
Alkaline Phosphatase: 59 U/L (ref 39–117)
Bilirubin, Direct: 0.1 mg/dL (ref 0.0–0.3)
Total Bilirubin: 0.7 mg/dL (ref 0.2–1.2)
Total Protein: 7.5 g/dL (ref 6.0–8.3)

## 2022-06-07 MED ORDER — CLOTRIMAZOLE-BETAMETHASONE 1-0.05 % EX CREA: 1.0000 | TOPICAL_CREAM | Freq: Every day | CUTANEOUS | 1 refills | Status: DC

## 2022-06-07 NOTE — Assessment & Plan Note (Signed)
Pt's BMI= 30.2  Encouraged low carb diet and regular exercise.  Check labs to risk stratify.

## 2022-06-07 NOTE — Patient Instructions (Addendum)
Establish care with your new provider to avoid a lapse in your care We'll notify you of your lab results and make any changes if needed Continue to work on healthy diet and regular exercise- you can do it! Apply the Lotrisone cream twice daily as needed for the rash on your chest Call with any questions or concerns Enjoy the rest of your summer!

## 2022-06-07 NOTE — Assessment & Plan Note (Signed)
Pt's PE WNL w/ exception of BMI and rash on chest.  UTD on cologuard, Tdap, PNA vaccines.  Check labs.  Anticipatory guidance provided.

## 2022-06-07 NOTE — Progress Notes (Signed)
   Subjective:    Patient ID: Brandon Bernard, male    DOB: 1950-10-30, 71 y.o.   MRN: [id number]  HPI CPE- UTD on cologuard, Tdap, PNA.  Pt reports feeling good.  No concerns today  Patient Care Team    Relationship Specialty Notifications Start End  [name], NP PCP - General Adult Health Nurse Practitioner  07/09/21   Mansouraty, Telford Nab., MD Consulting Physician Gastroenterology  07/22/18   Allyn Kenner, MD  Dermatology  07/22/18   [name], MD Consulting Physician Infectious Diseases  07/22/18   Henreitta Leber  Dentistry  07/22/18   Warren Danes, PA-C Physician Assistant Dermatology  12/13/20   Lavonna Monarch, MD Consulting Physician Dermatology  09/18/21      Health Maintenance  Topic Date Due   INFLUENZA VACCINE  01/19/2023 (Originally 05/21/2022)   Fecal DNA (Cologuard)  11/08/2024   TETANUS/TDAP  11/14/2031   Pneumonia Vaccine 58+ Years old  Completed   Hepatitis C Screening  Completed   Zoster Vaccines- Shingrix  Completed   HPV VACCINES  Aged Out   COVID-19 Vaccine  Discontinued      Review of Systems Patient reports no vision/hearing changes, anorexia, fever ,adenopathy, persistant/recurrent hoarseness, swallowing issues, chest pain, palpitations, edema, persistant/recurrent cough, hemoptysis, dyspnea (rest,exertional, paroxysmal nocturnal), gastrointestinal  bleeding (melena, rectal bleeding), abdominal pain, excessive heart burn, GU symptoms (dysuria, hematuria, voiding/incontinence issues) syncope, focal weakness, memory loss, numbness & tingling, hair/nail changes, depression, anxiety, abnormal bruising/bleeding, musculoskeletal symptoms/signs.   + rash on anterior chest, worse w/ sweat/heat.    Objective:   Physical Exam General Appearance:    Alert, cooperative, no distress, appears stated age  Head:    Normocephalic, without obvious abnormality, atraumatic  Eyes:    PERRL, conjunctiva/corneas clear, EOM's intact, fundi    benign, both eyes        Ears:    Normal TM's and external ear canals, both ears  Nose:   Nares normal, septum midline, mucosa normal, no drainage   or sinus tenderness  Throat:   Lips, mucosa, and tongue normal; teeth and gums normal  Neck:   Supple, symmetrical, trachea midline, no adenopathy;       thyroid:  No enlargement/tenderness/nodules  Back:     Symmetric, no curvature, ROM normal, no CVA tenderness  Lungs:     Clear to auscultation bilaterally, respirations unlabored  Chest wall:    No tenderness or deformity  Heart:    Regular rate and rhythm, S1 and S2 normal, no murmur, rub   or gallop  Abdomen:     Soft, non-tender, bowel sounds active all four quadrants,    no masses, no organomegaly  Genitalia:    Normal male without lesion, masses,discharge or tenderness  Rectal:    Deferred due to young age  Extremities:   Extremities normal, atraumatic, no cyanosis or edema  Pulses:   2+ and symmetric all extremities  Skin:   Skin color, texture, turgor normal, diffuse erythematous maculopapular rash on chest  Lymph nodes:   Cervical, supraclavicular, and axillary nodes normal  Neurologic:   CNII-XII intact. Normal strength, sensation and reflexes      throughout          Assessment & Plan:

## 2022-06-11 NOTE — Progress Notes (Signed)
Informed pt wife of lab results

## 2022-07-26 ENCOUNTER — Encounter: Payer: Self-pay | Admitting: Family Medicine

## 2022-11-13 ENCOUNTER — Encounter: Payer: Self-pay | Admitting: Family Medicine

## 2022-11-13 ENCOUNTER — Ambulatory Visit (INDEPENDENT_AMBULATORY_CARE_PROVIDER_SITE_OTHER): Payer: Medicare Other | Admitting: Family Medicine

## 2022-11-13 VITALS — BP 140/94 | HR 73 | Temp 98.1°F | Resp 16 | Ht 63.0 in | Wt 175.5 lb

## 2022-11-13 DIAGNOSIS — M62830 Muscle spasm of back: Secondary | ICD-10-CM | POA: Diagnosis not present

## 2022-11-13 MED ORDER — TIZANIDINE HCL 4 MG PO TABS: 4.0000 mg | ORAL_TABLET | Freq: Three times a day (TID) | ORAL | 0 refills | Status: DC | PRN

## 2022-11-13 MED ORDER — PREDNISONE 10 MG PO TABS: ORAL_TABLET | ORAL | 0 refills | Status: DC

## 2022-11-13 NOTE — Progress Notes (Signed)
   Subjective:    Patient ID: Brandon Bernard, male    DOB: 28-May-1951, 72 y.o.   MRN: [id number]  HPI Back pain- pt was coming in from the garage on Saturday and stumbled on the steps.  Grabbed the railing and felt a sharp pull.  Went to bed.  Pain woke him overnight.  R sided over lower ribs.  Has been taking acetaminophen and ibuprofen w/ some relief.  Last night attempted to get out of chair and had 'intense pain'.  Pt is having spasms that will 'grab you'.  No blood in urine.  Now has a small bruise over R posterior rib in mid scapular line   Review of Systems For ROS see HPI     Objective:   Physical Exam Vitals reviewed.  Constitutional:      General: He is not in acute distress.    Appearance: Normal appearance. He is not ill-appearing.  HENT:     Head: Normocephalic and atraumatic.  Eyes:     Extraocular Movements: Extraocular movements intact.     Conjunctiva/sclera: Conjunctivae normal.  Abdominal:     Tenderness: There is no right CVA tenderness, left CVA tenderness, guarding or rebound.  Musculoskeletal:        General: Tenderness (TTP over R lat w/ obvious spasm present) present. No swelling.     Cervical back: Normal range of motion and neck supple.  Skin:    General: Skin is warm and dry.     Findings: Bruising (faint over R lat) present. No rash.  Neurological:     General: No focal deficit present.     Mental Status: He is alert and oriented to person, place, and time.  Psychiatric:        Mood and Affect: Mood normal.        Behavior: Behavior normal.        Thought Content: Thought content normal.           Assessment & Plan:  Back spasm- new.  Pt w/ obvious spasm of R lat on PE.  Start Prednisone taper due to inadequate relief from OTC NSAIDs.  Add Tizanidine as needed for spasm.  Encouraged heat or ice.  Reviewed supportive care and red flags that should prompt return.  Pt expressed understanding and is in agreement w/ plan.

## 2022-11-13 NOTE — Patient Instructions (Signed)
Follow up as needed or as scheduled START the Prednisone as directed- 3 pills at the same time x3 days, then 2 pills at the same time x3 days, then 1 pill daily.  Take w/ food DO NOT take any additional ibuprofen/motrin/aleve while on the Prednisone You CAN take Acetaminophen/Tylenol HEAT!!! Call with any questions or concerns Hang in there!!

## 2022-12-10 ENCOUNTER — Ambulatory Visit: Payer: Medicare Other | Admitting: Physician Assistant

## 2022-12-17 ENCOUNTER — Ambulatory Visit (INDEPENDENT_AMBULATORY_CARE_PROVIDER_SITE_OTHER): Payer: Medicare Other | Admitting: Family Medicine

## 2022-12-17 ENCOUNTER — Encounter: Payer: Self-pay | Admitting: Family Medicine

## 2022-12-17 VITALS — BP 122/66 | HR 73 | Temp 98.0°F | Ht 63.0 in | Wt 178.8 lb

## 2022-12-17 DIAGNOSIS — Z85828 Personal history of other malignant neoplasm of skin: Secondary | ICD-10-CM

## 2022-12-17 DIAGNOSIS — Z1283 Encounter for screening for malignant neoplasm of skin: Secondary | ICD-10-CM

## 2022-12-17 NOTE — Progress Notes (Signed)
New Patient Office Visit  Subjective    Patient ID: Brandon Bernard, male    DOB: [birth year]  Age: 72 y.o. MRN: [id number]  CC:  Chief Complaint  Patient presents with   Referral    Pt is requesting referral to derm for routine skin checks     HPI Brandon Bernard presents to establish care with new provider and needs a referral.   Patient has a history of skin cancer. He previously has been seen at [facility]. He requesting a referral for full body skin screening.   Otherwise, a healthy man for his age. He reports he exercises by walking and trail biking with weather permitting. He denies weight training since covid. He reports he eats a well balanced diet with leaning toward being a vegetarian. He does eat some lean meats and fish. Mostly drinks herbal teas and juices, hardly any water.   He has no other concerns today.   Outpatient Encounter Medications as of 12/17/2022  Medication Sig   Multiple Vitamin (MULTIVITAMIN) tablet Take 1 tablet by mouth daily.   [DISCONTINUED] alclomethasone (ACLOVATE) 0.05 % cream Apply topically 2 (two) times daily as needed (Rash).   [DISCONTINUED] clotrimazole-betamethasone (LOTRISONE) cream Apply 1 Application topically daily.   [DISCONTINUED] predniSONE (DELTASONE) 10 MG tablet 3 tabs x3 days and then 2 tabs x3 days and then 1 tab x3 days.  Take w/ food.   [DISCONTINUED] tiZANidine (ZANAFLEX) 4 MG tablet Take 1 tablet (4 mg total) by mouth every 8 (eight) hours as needed for muscle spasms.   No facility-administered encounter medications on file as of 12/17/2022.    Past Medical History:  Diagnosis Date   Basal cell carcinoma 12/13/2020   sup- right upper back    Basal cell carcinoma 12/13/2020   sup & nod- left flank(EXC)   Concussion    Hepatitis C 2019   treated in Spring 2019    History of chickenpox    Plantar fasciitis    Seasonal allergies    Wears glasses     Past Surgical History:  Procedure Laterality Date    EYE SURGERY Left 1996   Orbital Reconstruction   FRACTURE SURGERY  2014   Left ankle, Right knee, Right wrist, larynx, Lumbar vertebrae, cervical vertebra   HERNIA REPAIR  1994   TONSILLECTOMY     wisdom teeth ext      Family History  Problem Relation Age of Onset   Hypertension Mother    Heart disease Mother    Esophageal cancer Neg Hx    Inflammatory bowel disease Neg Hx    Liver disease Neg Hx    Pancreatic cancer Neg Hx    Stomach cancer Neg Hx    Rectal cancer Neg Hx    Colon cancer Neg Hx    Colon polyps Neg Hx     Social History   Socioeconomic History   Marital status: Married    Spouse name: Not on file   Number of children: 0   Years of education: Not on file   Highest education level: Bachelor's degree (e.g., BA, AB, BS)  Occupational History   Occupation: Retired  Tobacco Use   Smoking status: Never   Smokeless tobacco: Never  Vaping Use   Vaping Use: Never used  Substance and Sexual Activity   Alcohol use: Yes    Alcohol/week: 1.0 standard drink of alcohol    Types: 1 Glasses of wine per week    Comment: 1 glass of wine  at holidays   Drug use: Never   Sexual activity: Yes    Partners: Female  Other Topics Concern   Not on file  Social History Narrative   Patient lives with his spouse; 2 cats;    Social Determinants of Health   Financial Resource Strain: Low Risk  (10/18/2021)   Overall Financial Resource Strain (CARDIA)    Difficulty of Paying Living Expenses: Not hard at all  Food Insecurity: No Food Insecurity (10/18/2021)   Hunger Vital Sign    Worried About Running Out of Food in the Last Year: Never true    Ran Out of Food in the Last Year: Never true  Transportation Needs: No Transportation Needs (10/18/2021)   PRAPARE - Hydrologist (Medical): No    Lack of Transportation (Non-Medical): No  Physical Activity: Sufficiently Active (10/18/2021)   Exercise Vital Sign    Days of Exercise per Week: 7 days     Minutes of Exercise per Session: 30 min  Stress: No Stress Concern Present (10/18/2021)   [place]    Feeling of Stress : Not at all  Social Connections: Moderately Integrated (10/18/2021)   Social Connection and Isolation Panel [NHANES]    Frequency of Communication with Friends and Family: Twice a week    Frequency of Social Gatherings with Friends and Family: Twice a week    Attends Religious Services: Never    Marine scientist or Organizations: Yes    Attends Music therapist: More than 4 times per year    Marital Status: Married  Human resources officer Violence: Not At Risk (10/18/2021)   Humiliation, Afraid, Rape, and Kick questionnaire    Fear of Current or Ex-Partner: No    Emotionally Abused: No    Physically Abused: No    Sexually Abused: No    ROS See HPI above    Objective    BP 122/66   Pulse 73   Temp 98 F (36.7 C) (Temporal)   Ht '5\' 3"'$  (1.6 m)   Wt 178 lb 12.8 oz (81.1 kg)   SpO2 99%   BMI 31.67 kg/m   Physical Exam Vitals reviewed.  Constitutional:      General: He is not in acute distress.    Appearance: Normal appearance. He is not ill-appearing or toxic-appearing.  Eyes:     General:        Right eye: No discharge.        Left eye: No discharge.     Conjunctiva/sclera: Conjunctivae normal.  Cardiovascular:     Rate and Rhythm: Normal rate and regular rhythm.     Heart sounds: Normal heart sounds. No murmur heard.    No friction rub. No gallop.  Pulmonary:     Effort: Pulmonary effort is normal. No respiratory distress.     Breath sounds: Normal breath sounds.  Musculoskeletal:        General: Normal range of motion.  Skin:    General: Skin is warm and dry.  Neurological:     General: No focal deficit present.     Mental Status: He is alert and oriented to person, place, and time. Mental status is at baseline.  Psychiatric:        Mood and Affect: Mood  normal.        Behavior: Behavior normal.        Thought Content: Thought content normal.  Judgment: Judgment normal.      Assessment & Plan:  Will need AWV when health coach is available. He reports he has had RSV vaccine at [facility] [place] and UTD on covid with boosters vaccines.  Referral to dermatology for a full body skin assessment for skin cancer. Patient has a history of skin cancer.  Skin cancer screening -     Ambulatory referral to Dermatology  History of skin cancer -     Ambulatory referral to Dermatology    Return in about 6 months (around 06/17/2023) for physical.   Valarie Merino, NP

## 2022-12-17 NOTE — Patient Instructions (Signed)
-  It was a pleasure to meet you and I look forward to taking care of you. -Sent referral to dermatology for full body skin assessment.  -Will follow up in 6 months for physical. -Keep up with exercising and a healthy diet.

## 2023-01-07 ENCOUNTER — Encounter: Payer: Self-pay | Admitting: Dermatology

## 2023-01-07 ENCOUNTER — Ambulatory Visit: Payer: Medicare Other | Admitting: Dermatology

## 2023-01-07 VITALS — BP 161/92 | HR 65

## 2023-01-07 DIAGNOSIS — L570 Actinic keratosis: Secondary | ICD-10-CM | POA: Diagnosis not present

## 2023-01-07 DIAGNOSIS — L820 Inflamed seborrheic keratosis: Secondary | ICD-10-CM

## 2023-01-07 NOTE — Patient Instructions (Addendum)
Cryotherapy Aftercare  Wash gently with soap and water everyday.   Apply Vaseline and Band-Aid daily until healed.   Melanoma ABCDEs  Melanoma is the most dangerous type of skin cancer, and is the leading cause of death from skin disease.  You are more likely to develop melanoma if you: Have light-colored skin, light-colored eyes, or red or blond hair Spend a lot of time in the sun Tan regularly, either outdoors or in a tanning bed Have had blistering sunburns, especially during childhood Have a close family member who has had a melanoma Have atypical moles or large birthmarks  Early detection of melanoma is key since treatment is typically straightforward and cure rates are extremely high if we catch it early.   The first sign of melanoma is often a change in a mole or a new dark spot.  The ABCDE system is a way of remembering the signs of melanoma.  A for asymmetry:  The two halves do not match. B for border:  The edges of the growth are irregular. C for color:  A mixture of colors are present instead of an even brown color. D for diameter:  Melanomas are usually (but not always) greater than 80mm - the size of a pencil eraser. E for evolution:  The spot keeps changing in size, shape, and color.  Please check your skin once per month between visits. You can use a small mirror in front and a large mirror behind you to keep an eye on the back side or your body.   If you see any new or changing lesions before your next follow-up, please call to schedule a visit.  Please continue daily skin protection including broad spectrum sunscreen SPF 30+ to sun-exposed areas, reapplying every 2 hours as needed when you're outdoors.    Due to recent changes in healthcare laws, you may see results of your pathology and/or laboratory studies on MyChart before the doctors have had a chance to review them. We understand that in some cases there may be results that are confusing or concerning to you.  Please understand that not all results are received at the same time and often the doctors may need to interpret multiple results in order to provide you with the best plan of care or course of treatment. Therefore, we ask that you please give [place] 2 business days to thoroughly review all your results before contacting the office for clarification. Should we see a critical lab result, you will be contacted sooner.   If You Need Anything After Your Visit  If you have any questions or concerns for your doctor, please call our main line at [phone] If no one answers, please leave a voicemail as directed and we will return your call as soon as possible. Messages left after 4 pm will be answered the following business day.   You may also send [place] a message via [place]. We typically respond to MyChart messages within 1-2 business days.  For prescription refills, please ask your pharmacy to contact our office. Our fax number is [phone].  If you have an urgent issue when the clinic is closed that cannot wait until the next business day, you can page your doctor at the number below.    Please note that while we do our best to be available for urgent issues outside of office hours, we are not available 24/7.   If you have an urgent issue and are unable to reach [place], you may choose to  seek medical care at your doctor's office, retail clinic, urgent care center, or emergency room.  If you have a medical emergency, please immediately call 911 or go to the emergency department. In the event of inclement weather, please call our main line at [phone] for an update on the status of any delays or closures.  Dermatology Medication Tips: Please keep the boxes that topical medications come in in order to help keep track of the instructions about where and how to use these. Pharmacies typically print the medication instructions only on the boxes and not directly on the medication tubes.   If your  medication is too expensive, please contact our office at [phone] or send [place] a message through [place].   We are unable to tell what your co-pay for medications will be in advance as this is different depending on your insurance coverage. However, we may be able to find a substitute medication at lower cost or fill out paperwork to get insurance to cover a needed medication.   If a prior authorization is required to get your medication covered by your insurance company, please allow [place] 1-2 business days to complete this process.  Drug prices often vary depending on where the prescription is filled and some pharmacies may offer cheaper prices.  The website www.goodrx.com contains coupons for medications through different pharmacies. The prices here do not account for what the cost may be with help from insurance (it may be cheaper with your insurance), but the website can give you the price if you did not use any insurance.  - You can print the associated coupon and take it with your prescription to the pharmacy.  - You may also stop by our office during regular business hours and pick up a GoodRx coupon card.  - If you need your prescription sent electronically to a different pharmacy, notify our office through [facility] or by phone at [phone]

## 2023-01-07 NOTE — Progress Notes (Signed)
New Patient Visit  Subjective  Brandon Bernard is a 72 y.o. male who presents for the following: TBSE (The patient presents for Total-Body Skin Exam (TBSE) for skin cancer screening and mole check.  The patient has spots, moles and lesions to be evaluated, some may be new or changing and the patient has concerns that these could be cancer. /Hx BCC, SCC>  years ago. Patient with something on chest and a red spot at stomach, present > 1 year.).    The following portions of the chart were reviewed this encounter and updated as appropriate:   Tobacco  Allergies  Meds  Problems  Med Hx  Surg Hx  Fam Hx      Review of Systems:  No other skin or systemic complaints except as noted in HPI or Assessment and Plan.  Objective  Well appearing patient in no apparent distress; mood and affect are within normal limits.  A full examination was performed including scalp, head, eyes, ears, nose, lips, neck, chest, axillae, abdomen, back, buttocks, bilateral upper extremities, bilateral lower extremities, hands, feet, fingers, toes, fingernails, and toenails. All findings within normal limits unless otherwise noted below.  A dermatoscope was used during the exam.  The following people were also present during my examination: , my medical assistant (male)   forehead, temples, cheeks, nose, hands x 5, R abdomen x 1 (12) Erythematous thin papules/macules with gritty scale.   L clavicle x 1, R clavice x 1 Erythematous stuck-on, waxy papule or plaque    Assessment & Plan  AK (actinic keratosis) (12) forehead, temples, cheeks, nose, hands x 5, R abdomen x 1  I counseled the patient regarding the following: Skin Care: Actinic Damage can improve with broad spectrum sunscreen, sun avoidance, bleaching creams, retinoids, chemical peels and laser. Expectations: Actinic Damage is photo-aging from excessive sun exposure. It manifests as unwanted pigmentation, wrinkles and textural thinning of the  skin.  I recommended the following: Broad Spectrum Sunscreen SPF 30+ - SPF 30 daily to face, neck, chest and hands, reapplying every 3 hours when outside for long periods of time   Destruction of lesion - forehead, temples, cheeks, nose, hands x 5, R abdomen x 1 Complexity: simple   Destruction method: cryotherapy   Informed consent: discussed and consent obtained   Timeout:  patient name, date of birth, surgical site, and procedure verified Lesion destroyed using liquid nitrogen: Yes   Region frozen until ice ball extended beyond lesion: Yes   Outcome: patient tolerated procedure well with no complications   Post-procedure details: wound care instructions given    Inflamed seborrheic keratosis L clavicle x 1, R clavice x 1     Education re: benign dx. Given sx, pt requests treatment with LN2.  Tx options reviewed    along with risks/benefits including risk of pain, blistering, scarring,    discoloration, incomplete removal, recurrence.  Pt gives verbal consent    to treatment.   Destruction of lesion - L clavicle x 1, R clavice x 1 Complexity: simple   Destruction method: cryotherapy   Informed consent: discussed and consent obtained   Timeout:  patient name, date of birth, surgical site, and procedure verified Lesion destroyed using liquid nitrogen: Yes   Region frozen until ice ball extended beyond lesion: Yes   Outcome: patient tolerated procedure well with no complications   Post-procedure details: wound care instructions given     Lentigines - Scattered tan macules - Due to sun exposure - Benign-appearing, observe -  Recommend daily broad spectrum sunscreen SPF 30+ to sun-exposed areas, reapply every 2 hours as needed. - Call for any changes  Seborrheic Keratoses - Stuck-on, waxy, tan-brown papules and/or plaques  - Benign-appearing - Discussed benign etiology and prognosis. - Observe - Call for any changes  Melanocytic Nevi - Tan-brown and/or  pink-flesh-colored symmetric macules and papules - Benign appearing on exam today - Observation - Call clinic for new or changing moles - Recommend daily use of broad spectrum spf 30+ sunscreen to sun-exposed areas.   Hemangiomas - Red papules - Discussed benign nature - Observe - Call for any changes  Actinic Damage - Chronic condition, secondary to cumulative UV/sun exposure - diffuse scaly erythematous macules with underlying dyspigmentation - Recommend daily broad spectrum sunscreen SPF 30+ to sun-exposed areas, reapply every 2 hours as needed.  - Staying in the shade or wearing long sleeves, sun glasses (UVA+UVB protection) and wide brim hats (4-inch brim around the entire circumference of the hat) are also recommended for sun protection.  - Call for new or changing lesions.  Skin cancer screening performed today.  History of Skin Cancer   Clear. Observe for recurrence.  Call clinic for new or changing lesions.   Recommend regular skin exams, daily broad-spectrum spf 30+ sunscreen use, and photoprotection.      Return in about 6 months (around 07/10/2023) for AK follow up, 1 year TBSE.   Documentation: I have reviewed the above documentation for accuracy and completeness, and I agree with the above  [place], DO

## 2023-01-15 ENCOUNTER — Telehealth: Payer: Self-pay | Admitting: Family Medicine

## 2023-01-15 NOTE — Telephone Encounter (Signed)
Contacted Theda Belfast to schedule their annual wellness visit. Appointment made for 01/23/2023.  Thank you,  [place] Direct dial  [phone]

## 2023-01-23 ENCOUNTER — Ambulatory Visit (INDEPENDENT_AMBULATORY_CARE_PROVIDER_SITE_OTHER): Payer: Medicare Other | Admitting: *Deleted

## 2023-01-23 DIAGNOSIS — Z0000 Encounter for general adult medical examination without abnormal findings: Secondary | ICD-10-CM

## 2023-01-23 NOTE — Patient Instructions (Signed)
Brandon Bernard , Thank you for taking time to come for your Medicare Wellness Visit. I appreciate your ongoing commitment to your health goals. Please review the following plan we discussed and let me know if I can assist you in the future.   Screening recommendations/referrals: Colonoscopy: up to date Recommended yearly ophthalmology/optometry visit for glaucoma screening and checkup Recommended yearly dental visit for hygiene and checkup  Vaccinations: Influenza vaccine: up to date Pneumococcal vaccine: up to date Tdap vaccine: up to date Shingles vaccine: up to date    Advanced directives: on file    Preventive Care 33 Years and Older, Male Preventive care refers to lifestyle choices and visits with your health care provider that can promote health and wellness. What does preventive care include? A yearly physical exam. This is also called an annual well check. Dental exams once or twice a year. Routine eye exams. Ask your health care provider how often you should have your eyes checked. Personal lifestyle choices, including: Daily care of your teeth and gums. Regular physical activity. Eating a healthy diet. Avoiding tobacco and drug use. Limiting alcohol use. Practicing safe sex. Taking low doses of aspirin every day. Taking vitamin and mineral supplements as recommended by your health care provider. What happens during an annual well check? The services and screenings done by your health care provider during your annual well check will depend on your age, overall health, lifestyle risk factors, and family history of disease. Counseling  Your health care provider may ask you questions about your: Alcohol use. Tobacco use. Drug use. Emotional well-being. Home and relationship well-being. Sexual activity. Eating habits. History of falls. Memory and ability to understand (cognition). Work and work Statistician. Screening  You may have the following tests or  measurements: Height, weight, and BMI. Blood pressure. Lipid and cholesterol levels. These may be checked every 5 years, or more frequently if you are over 34 years old. Skin check. Lung cancer screening. You may have this screening every year starting at age 56 if you have a 30-pack-year history of smoking and currently smoke or have quit within the past 15 years. Fecal occult blood test (FOBT) of the stool. You may have this test every year starting at age 24. Flexible sigmoidoscopy or colonoscopy. You may have a sigmoidoscopy every 5 years or a colonoscopy every 10 years starting at age 68. Prostate cancer screening. Recommendations will vary depending on your family history and other risks. Hepatitis C blood test. Hepatitis B blood test. Sexually transmitted disease (STD) testing. Diabetes screening. This is done by checking your blood sugar (glucose) after you have not eaten for a while (fasting). You may have this done every 1-3 years. Abdominal aortic aneurysm (AAA) screening. You may need this if you are a current or former smoker. Osteoporosis. You may be screened starting at age 49 if you are at high risk. Talk with your health care provider about your test results, treatment options, and if necessary, the need for more tests. Vaccines  Your health care provider may recommend certain vaccines, such as: Influenza vaccine. This is recommended every year. Tetanus, diphtheria, and acellular pertussis (Tdap, Td) vaccine. You may need a Td booster every 10 years. Zoster vaccine. You may need this after age 49. Pneumococcal 13-valent conjugate (PCV13) vaccine. One dose is recommended after age 20. Pneumococcal polysaccharide (PPSV23) vaccine. One dose is recommended after age 8. Talk to your health care provider about which screenings and vaccines you need and how often you need them.  This information is not intended to replace advice given to you by your health care provider. Make sure  you discuss any questions you have with your health care provider. Document Released: 11/03/2015 Document Revised: 06/26/2016 Document Reviewed: 08/08/2015 Elsevier Interactive Patient Education  2017 [employer] Prevention in the Home Falls can cause injuries. They can happen to people of all ages. There are many things you can do to make your home safe and to help prevent falls. What can I do on the outside of my home? Regularly fix the edges of walkways and driveways and fix any cracks. Remove anything that might make you trip as you walk through a door, such as a raised step or threshold. Trim any bushes or trees on the path to your home. Use bright outdoor lighting. Clear any walking paths of anything that might make someone trip, such as rocks or tools. Regularly check to see if handrails are loose or broken. Make sure that both sides of any steps have handrails. Any raised decks and porches should have guardrails on the edges. Have any leaves, snow, or ice cleared regularly. Use sand or salt on walking paths during winter. Clean up any spills in your garage right away. This includes oil or grease spills. What can I do in the bathroom? Use night lights. Install grab bars by the toilet and in the tub and shower. Do not use towel bars as grab bars. Use non-skid mats or decals in the tub or shower. If you need to sit down in the shower, use a plastic, non-slip stool. Keep the floor dry. Clean up any water that spills on the floor as soon as it happens. Remove soap buildup in the tub or shower regularly. Attach bath mats securely with double-sided non-slip rug tape. Do not have throw rugs and other things on the floor that can make you trip. What can I do in the bedroom? Use night lights. Make sure that you have a light by your bed that is easy to reach. Do not use any sheets or blankets that are too big for your bed. They should not hang down onto the floor. Have a firm  chair that has side arms. You can use this for support while you get dressed. Do not have throw rugs and other things on the floor that can make you trip. What can I do in the kitchen? Clean up any spills right away. Avoid walking on wet floors. Keep items that you use a lot in easy-to-reach places. If you need to reach something above you, use a strong step stool that has a grab bar. Keep electrical cords out of the way. Do not use floor polish or wax that makes floors slippery. If you must use wax, use non-skid floor wax. Do not have throw rugs and other things on the floor that can make you trip. What can I do with my stairs? Do not leave any items on the stairs. Make sure that there are handrails on both sides of the stairs and use them. Fix handrails that are broken or loose. Make sure that handrails are as long as the stairways. Check any carpeting to make sure that it is firmly attached to the stairs. Fix any carpet that is loose or worn. Avoid having throw rugs at the top or bottom of the stairs. If you do have throw rugs, attach them to the floor with carpet tape. Make sure that you have a light switch at the  top of the stairs and the bottom of the stairs. If you do not have them, ask someone to add them for you. What else can I do to help prevent falls? Wear shoes that: Do not have high heels. Have rubber bottoms. Are comfortable and fit you well. Are closed at the toe. Do not wear sandals. If you use a stepladder: Make sure that it is fully opened. Do not climb a closed stepladder. Make sure that both sides of the stepladder are locked into place. Ask someone to hold it for you, if possible. Clearly mark and make sure that you can see: Any grab bars or handrails. First and last steps. Where the edge of each step is. Use tools that help you move around (mobility aids) if they are needed. These include: Canes. Walkers. Scooters. Crutches. Turn on the lights when you go  into a dark area. Replace any light bulbs as soon as they burn out. Set up your furniture so you have a clear path. Avoid moving your furniture around. If any of your floors are uneven, fix them. If there are any pets around you, be aware of where they are. Review your medicines with your doctor. Some medicines can make you feel dizzy. This can increase your chance of falling. Ask your doctor what other things that you can do to help prevent falls. This information is not intended to replace advice given to you by your health care provider. Make sure you discuss any questions you have with your health care provider. Document Released: 08/03/2009 Document Revised: 03/14/2016 Document Reviewed: 11/11/2014 Elsevier Interactive Patient Education  2017 [employer].

## 2023-01-23 NOTE — Progress Notes (Signed)
Subjective:   Brandon Bernard is a 72 y.o. male who presents for Medicare Annual/Subsequent preventive examination.  I connected with  Brandon Bernard on 01/23/23 by a telephone enabled telemedicine application and verified that I am speaking with the correct person using two identifiers.   I discussed the limitations of evaluation and management by telemedicine. The patient expressed understanding and agreed to proceed.  Patient location: home  Provider location: telephone/home    Review of Systems     Cardiac Risk Factors include: advanced age (>4men, >49 women);family history of premature cardiovascular disease;male gender     Objective:    Today's Vitals   There is no height or weight on file to calculate BMI.     01/23/2023    8:06 AM 10/18/2021    9:05 AM 07/31/2020    8:18 AM 07/26/2019    9:31 AM 07/22/2018    9:14 AM  Advanced Directives  Does Patient Have a Medical Advance Directive? Yes Yes Yes Yes Yes  Type of Paramedic of [place];Living will [place];Living will [place];Living will [place];Living will  Copy of [place] in Chart? Yes - validated most recent copy scanned in chart (See row information) No - copy requested No - copy requested No - copy requested No - copy requested    Current Medications (verified) Outpatient Encounter Medications as of 01/23/2023  Medication Sig   Multiple Vitamin (MULTIVITAMIN) tablet Take 1 tablet by mouth daily.   No facility-administered encounter medications on file as of 01/23/2023.    Allergies (verified) Minocycline   History: Past Medical History:  Diagnosis Date   Basal cell carcinoma 12/13/2020   sup- right upper back    Basal cell carcinoma 12/13/2020   sup & nod- left flank(EXC)   Concussion    Hepatitis C 2019   treated in Spring 2019    History of chickenpox    Plantar  fasciitis    Seasonal allergies    Wears glasses    Past Surgical History:  Procedure Laterality Date   EYE SURGERY Left 1996   Orbital Reconstruction   FRACTURE SURGERY  2014   Left ankle, Right knee, Right wrist, larynx, Lumbar vertebrae, cervical vertebra   HERNIA REPAIR  1994   TONSILLECTOMY     wisdom teeth ext     Family History  Problem Relation Age of Onset   Hypertension Mother    Heart disease Mother    Esophageal cancer Neg Hx    Inflammatory bowel disease Neg Hx    Liver disease Neg Hx    Pancreatic cancer Neg Hx    Stomach cancer Neg Hx    Rectal cancer Neg Hx    Colon cancer Neg Hx    Colon polyps Neg Hx    Social History   Socioeconomic History   Marital status: Married    Spouse name: Not on file   Number of children: 0   Years of education: Not on file   Highest education level: Bachelor's degree (e.g., BA, AB, BS)  Occupational History   Occupation: Retired  Tobacco Use   Smoking status: Never   Smokeless tobacco: Never  Vaping Use   Vaping Use: Never used  Substance and Sexual Activity   Alcohol use: Yes    Alcohol/week: 1.0 standard drink of alcohol    Types: 1 Glasses of wine per week    Comment: 1  glass of wine at holidays   Drug use: Never   Sexual activity: Yes    Partners: Female  Other Topics Concern   Not on file  Social History Narrative   Patient lives with his spouse; 2 cats;    Social Determinants of Health   Financial Resource Strain: Low Risk  (01/23/2023)   Overall Financial Resource Strain (CARDIA)    Difficulty of Paying Living Expenses: Not hard at all  Food Insecurity: No Food Insecurity (01/23/2023)   Hunger Vital Sign    Worried About Running Out of Food in the Last Year: Never true    Ran Out of Food in the Last Year: Never true  Transportation Needs: No Transportation Needs (01/23/2023)   PRAPARE - Hydrologist (Medical): No    Lack of Transportation (Non-Medical): No  Physical  Activity: Inactive (01/23/2023)   Exercise Vital Sign    Days of Exercise per Week: 0 days    Minutes of Exercise per Session: 0 min  Stress: No Stress Concern Present (01/23/2023)   [place]    Feeling of Stress : Not at all  Social Connections: Moderately Integrated (01/23/2023)   Social Connection and Isolation Panel [NHANES]    Frequency of Communication with Friends and Family: More than three times a week    Frequency of Social Gatherings with Friends and Family: Three times a week    Attends Religious Services: Never    Active Member of Clubs or Organizations: Yes    Attends Music therapist: More than 4 times per year    Marital Status: Married    Tobacco Counseling Counseling given: Not Answered   Clinical Intake:  Pre-visit preparation completed: Yes  Pain : No/denies pain     Diabetes: No  How often do you need to have someone help you when you read instructions, pamphlets, or other written materials from your doctor or pharmacy?: 1 - Never  Diabetic?  no  Interpreter Needed?: No  Information entered by :: Leroy Kennedy LPN   Activities of Daily Living    01/23/2023    8:09 AM 01/20/2023    5:51 PM  In your present state of health, do you have any difficulty performing the following activities:  Hearing? 0 0  Vision? 0 0  Difficulty concentrating or making decisions? 0 0  Walking or climbing stairs? 0 0  Dressing or bathing? 0 0  Doing errands, shopping? 0 0  Preparing Food and eating ? N N  Using the Toilet? N N  In the past six months, have you accidently leaked urine? N N  Do you have problems with loss of bowel control? N N  Managing your Medications? N N  Managing your Finances? N N  Housekeeping or managing your Housekeeping? N N    Patient Care Team: Evangeline Gula, NP as PCP - General (Family Medicine) Mansouraty, Telford Nab., MD as Consulting Physician  (Gastroenterology) Allyn Kenner, MD (Dermatology) Comer, Okey Regal, MD as Consulting Physician (Infectious Diseases) Henreitta Leber (Dentistry) Warren Danes, PA-C as Physician Assistant (Dermatology) Lavonna Monarch, MD (Inactive) as Consulting Physician (Dermatology)  Indicate any recent Medical Services you may have received from other than [facility] providers in the past year (date may be approximate).     Assessment:   This is a routine wellness examination for [name].  Hearing/Vision screen Hearing Screening - Comments:: No trouble hearing Vision Screening - Comments:: Up  to date [place] [facility]  Dietary issues and exercise activities discussed: Current Exercise Habits: The patient does not participate in regular exercise at present   Goals Addressed             This Visit's Progress    Patient Stated       Complete remodel of house in [facility]       Depression Screen    01/23/2023    8:11 AM 12/17/2022    9:24 AM 11/13/2022   11:35 AM 06/07/2022    8:18 AM 10/24/2021    2:34 PM 10/18/2021    9:10 AM 10/18/2021    9:03 AM  PHQ 2/9 Scores  PHQ - 2 Score 0 0 0 0 0 0 0  PHQ- 9 Score 0 0 0 0 0      Fall Risk    01/23/2023    8:06 AM 01/20/2023    5:51 PM 12/17/2022    9:24 AM 11/13/2022   11:35 AM 06/07/2022    8:19 AM  [place] in the past year? 0 0 0 1 0  Number falls in past yr: 0  0 0   Injury with Fall? 0 0 0 1   Risk for fall due to :   No Fall Risks History of fall(s)   Follow up Falls evaluation completed;Education provided;Falls prevention discussed  Falls evaluation completed Falls evaluation completed Falls evaluation completed    FALL RISK PREVENTION PERTAINING TO THE HOME:  Any stairs in or around the home? Yes  If so, are there any without handrails? No  Home free of loose throw rugs in walkways, pet beds, electrical cords, etc? Yes  Adequate lighting in your home to reduce risk of falls? Yes   ASSISTIVE DEVICES UTILIZED TO PREVENT  FALLS:  Life alert? No  Use of a cane, walker or w/c? No  Grab bars in the bathroom? No  Shower chair or bench in shower? No  Elevated toilet seat or a handicapped toilet? No   TIMED UP AND GO:  Was the test performed? No .    Cognitive Function:    07/26/2019    9:59 AM 07/22/2018    9:18 AM  MMSE - Mini Mental State Exam  Orientation to time 5 5  Orientation to Place 5 5  Registration 3 3  Attention/ Calculation 5 5  Recall 3 3  Language- name 2 objects 2 2  Language- repeat 1 1  Language- follow 3 step command 3 3  Language- read & follow direction 1 1  Write a sentence 1 1  Copy design 1 1  Total score 30 30        01/23/2023    8:09 AM  6CIT Screen  What Year? 0 points  What month? 0 points  What time? 0 points  Count back from 20 0 points  Months in reverse 0 points  Repeat phrase 0 points  Total Score 0 points    Immunizations Immunization History  Administered Date(s) Administered   Fluad Quad(high Dose 65+) 08/09/2020   Influenza-Unspecified 07/25/2021, 07/25/2022   PFIZER(Purple Top)SARS-COV-2 Vaccination 12/28/2019, 01/18/2020, 07/17/2020, 03/16/2021, 07/25/2021   Pfizer Covid-19 Vaccine Bivalent Booster 26yrs & up 07/25/2022   Pneumococcal Conjugate-13 07/26/2019   Pneumococcal Polysaccharide-23 10/30/2017   Tdap 10/22/2011, 11/13/2021   Zoster Recombinat (Shingrix) 11/13/2021, 01/11/2022    TDAP status: Up to date  Flu Vaccine status: Up to date  Pneumococcal vaccine status: Up to date  Covid-19 vaccine  status: Information provided on how to obtain vaccines.   Qualifies for Shingles Vaccine? No   Zostavax completed No   Shingrix Completed?: Yes  Screening Tests Health Maintenance  Topic Date Due   INFLUENZA VACCINE  05/22/2023   Medicare Annual Wellness (AWV)  01/23/2024   Fecal DNA (Cologuard)  11/08/2024   DTaP/Tdap/Td (3 - Td or Tdap) 11/14/2031   Pneumonia Vaccine 34+ Years old  Completed   Hepatitis C Screening  Completed    Zoster Vaccines- Shingrix  Completed   HPV VACCINES  Aged Out   COVID-19 Vaccine  Discontinued    Health Maintenance  There are no preventive care reminders to display for this patient.   Colorectal cancer screening: Type of screening: Cologuard. Completed 2023. Repeat every 3 years  Lung Cancer Screening: (Low Dose CT Chest recommended if Age 74-80 years, 30 pack-year currently smoking OR have quit w/in 15years.) does not qualify.   Lung Cancer Screening Referral:   Additional Screening:  Hepatitis C Screening: does not qualify; Completed 2021  Vision Screening: Recommended annual ophthalmology exams for early detection of glaucoma and other disorders of the eye. Is the patient up to date with their annual eye exam?  Yes  Who is the provider or what is the name of the office in which the patient attends annual eye exams? [place] If pt is not established with a provider, would they like to be referred to a provider to establish care? No .   Dental Screening: Recommended annual dental exams for proper oral hygiene  Community Resource Referral / Chronic Care Management: CRR required this visit?  No   CCM required this visit?  No      Plan:     I have personally reviewed and noted the following in the patient's chart:   Medical and social history Use of alcohol, tobacco or illicit drugs  Current medications and supplements including opioid prescriptions. Patient is not currently taking opioid prescriptions. Functional ability and status Nutritional status Physical activity Advanced directives List of other physicians Hospitalizations, surgeries, and ER visits in previous 12 months Vitals Screenings to include cognitive, depression, and falls Referrals and appointments  In addition, I have reviewed and discussed with patient certain preventive protocols, quality metrics, and best practice recommendations. A written personalized care plan for preventive services  as well as general preventive health recommendations were provided to patient.     Leroy Kennedy, LPN   [id number]   Nurse Notes:

## 2023-05-21 ENCOUNTER — Encounter (INDEPENDENT_AMBULATORY_CARE_PROVIDER_SITE_OTHER): Payer: Self-pay

## 2023-06-16 ENCOUNTER — Other Ambulatory Visit: Payer: Self-pay | Admitting: Medical Genetics

## 2023-06-16 DIAGNOSIS — Z006 Encounter for examination for normal comparison and control in clinical research program: Secondary | ICD-10-CM

## 2023-06-18 ENCOUNTER — Encounter: Payer: Self-pay | Admitting: Family Medicine

## 2023-06-18 ENCOUNTER — Telehealth: Payer: Self-pay

## 2023-06-18 ENCOUNTER — Ambulatory Visit (INDEPENDENT_AMBULATORY_CARE_PROVIDER_SITE_OTHER): Payer: Medicare Other | Admitting: Family Medicine

## 2023-06-18 VITALS — BP 128/62 | HR 60 | Temp 97.9°F | Ht 62.0 in | Wt 170.2 lb

## 2023-06-18 DIAGNOSIS — Z13220 Encounter for screening for lipoid disorders: Secondary | ICD-10-CM

## 2023-06-18 DIAGNOSIS — E669 Obesity, unspecified: Secondary | ICD-10-CM

## 2023-06-18 DIAGNOSIS — Z0000 Encounter for general adult medical examination without abnormal findings: Secondary | ICD-10-CM

## 2023-06-18 DIAGNOSIS — Z125 Encounter for screening for malignant neoplasm of prostate: Secondary | ICD-10-CM | POA: Diagnosis not present

## 2023-06-18 LAB — COMPREHENSIVE METABOLIC PANEL
ALT: 13 U/L (ref 0–53)
AST: 21 U/L (ref 0–37)
Albumin: 4.4 g/dL (ref 3.5–5.2)
Alkaline Phosphatase: 60 U/L (ref 39–117)
BUN: 11 mg/dL (ref 6–23)
CO2: 28 mEq/L (ref 19–32)
Calcium: 9.7 mg/dL (ref 8.4–10.5)
Chloride: 102 mEq/L (ref 96–112)
Creatinine, Ser: 0.84 mg/dL (ref 0.40–1.50)
GFR: 87.82 mL/min (ref >60.00)
Glucose, Bld: 104 mg/dL — ABNORMAL HIGH (ref 70–99)
Potassium: 4.9 mEq/L (ref 3.5–5.1)
Sodium: 139 mEq/L (ref 135–145)
Total Bilirubin: 0.5 mg/dL (ref 0.2–1.2)
Total Protein: 7.5 g/dL (ref 6.0–8.3)

## 2023-06-18 LAB — CBC WITH DIFFERENTIAL/PLATELET
Basophils Absolute: 0 10*3/uL (ref 0.0–0.1)
Basophils Relative: 0.8 % (ref 0.0–3.0)
Eosinophils Absolute: 0.1 10*3/uL (ref 0.0–0.7)
Eosinophils Relative: 2.4 % (ref 0.0–5.0)
HCT: 45.1 % (ref 39.0–52.0)
Hemoglobin: 14.9 g/dL (ref 13.0–17.0)
Lymphocytes Relative: 24.3 % (ref 12.0–46.0)
Lymphs Abs: 1.1 10*3/uL (ref 0.7–4.0)
MCHC: 33 g/dL (ref 30.0–36.0)
MCV: 97.6 fl (ref 78.0–100.0)
Monocytes Absolute: 0.6 10*3/uL (ref 0.1–1.0)
Monocytes Relative: 12.3 % — ABNORMAL HIGH (ref 3.0–12.0)
Neutro Abs: 2.8 10*3/uL (ref 1.4–7.7)
Neutrophils Relative %: 60.2 % (ref 43.0–77.0)
Platelets: 227 10*3/uL (ref 150.0–400.0)
RBC: 4.62 Mil/uL (ref 4.22–5.81)
RDW: 13.6 % (ref 11.5–15.5)
WBC: 4.6 10*3/uL (ref 4.0–10.5)

## 2023-06-18 LAB — LIPID PANEL
Cholesterol: 195 mg/dL (ref 0–200)
HDL: 80.8 mg/dL (ref >39.00)
LDL Cholesterol: 104 mg/dL — ABNORMAL HIGH (ref 0–99)
NonHDL: 114.65
Total CHOL/HDL Ratio: 2
Triglycerides: 54 mg/dL (ref 0.0–149.0)
VLDL: 10.8 mg/dL (ref 0.0–40.0)

## 2023-06-18 LAB — PSA: PSA: 3.64 ng/mL (ref 0.10–4.00)

## 2023-06-18 LAB — TSH: TSH: 0.99 u[IU]/mL (ref 0.35–5.50)

## 2023-06-18 NOTE — Telephone Encounter (Signed)
Pt is aware of lab results.

## 2023-06-18 NOTE — Telephone Encounter (Signed)
-----   Message from Zandra Abts sent at 06/18/2023  3:49 PM EDT ----- Labs are stable. No concern.
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
## 2023-06-18 NOTE — Patient Instructions (Signed)
-  Physical exam completed today with no concerns.  -Ordered screening labs. Office will call with lab results and you will see results on MyChart. Will follow up sooner if their is major concerns with lab results.  -Continue with a healthy diet and regular exercise.  -Follow up in 1 year for a physical.
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
## 2023-07-10 ENCOUNTER — Ambulatory Visit: Payer: Medicare Other | Admitting: Dermatology

## 2023-07-11 ENCOUNTER — Encounter: Payer: Self-pay | Admitting: Family Medicine

## 2023-11-11 DIAGNOSIS — H2513 Age-related nuclear cataract, bilateral: Secondary | ICD-10-CM | POA: Diagnosis not present

## 2023-12-31 ENCOUNTER — Ambulatory Visit: Payer: Medicare Other | Admitting: Dermatology

## 2024-01-12 ENCOUNTER — Ambulatory Visit: Payer: Medicare Other | Admitting: Dermatology

## 2024-01-12 ENCOUNTER — Encounter: Payer: Self-pay | Admitting: Dermatology

## 2024-01-12 VITALS — BP 190/56

## 2024-01-12 DIAGNOSIS — L821 Other seborrheic keratosis: Secondary | ICD-10-CM | POA: Diagnosis not present

## 2024-01-12 DIAGNOSIS — W908XXA Exposure to other nonionizing radiation, initial encounter: Secondary | ICD-10-CM

## 2024-01-12 DIAGNOSIS — L570 Actinic keratosis: Secondary | ICD-10-CM | POA: Diagnosis not present

## 2024-01-12 DIAGNOSIS — L814 Other melanin hyperpigmentation: Secondary | ICD-10-CM | POA: Diagnosis not present

## 2024-01-12 DIAGNOSIS — L578 Other skin changes due to chronic exposure to nonionizing radiation: Secondary | ICD-10-CM | POA: Diagnosis not present

## 2024-01-12 DIAGNOSIS — L719 Rosacea, unspecified: Secondary | ICD-10-CM

## 2024-01-12 DIAGNOSIS — Z1283 Encounter for screening for malignant neoplasm of skin: Secondary | ICD-10-CM | POA: Diagnosis not present

## 2024-01-12 DIAGNOSIS — D1801 Hemangioma of skin and subcutaneous tissue: Secondary | ICD-10-CM | POA: Diagnosis not present

## 2024-01-12 DIAGNOSIS — Z85828 Personal history of other malignant neoplasm of skin: Secondary | ICD-10-CM | POA: Diagnosis not present

## 2024-01-12 DIAGNOSIS — Z8589 Personal history of malignant neoplasm of other organs and systems: Secondary | ICD-10-CM

## 2024-01-12 DIAGNOSIS — D229 Melanocytic nevi, unspecified: Secondary | ICD-10-CM

## 2024-01-12 MED ORDER — HYDROCORTISONE 2.5 % EX CREA: TOPICAL_CREAM | Freq: Two times a day (BID) | CUTANEOUS | 3 refills | Status: AC | PRN

## 2024-01-12 MED ORDER — CLINDAMYCIN HCL 300 MG PO CAPS: 300.0000 mg | ORAL_CAPSULE | Freq: Every day | ORAL | 3 refills | Status: AC

## 2024-01-12 NOTE — Patient Instructions (Addendum)
 Hello [name],  Thank you for visiting today. Here is a summary of the key instructions:  Rosacea - Medications: Take clindamycin 300 mg once a day for 1 week   - 2 refills available for future flares  - Topical Treatments: Apply metronidazole cream twice a day to affected areas on nose  - Lifestyle Changes:   - Wear sunscreen daily, especially in summer   - Try to identify triggers for rosacea flares (e.g., alcohol, sun, stress)  - Follow-up: Return for follow-up appointment in 6 months  Please reach out if you have any questions or concerns.  Warm regards,  Dr. Langston Reusing, Dermatology      Cryotherapy Aftercare  Wash gently with soap and water everyday.   Apply Vaseline and Band-Aid daily until healed.    Important Information  Due to recent changes in healthcare laws, you may see results of your pathology and/or laboratory studies on MyChart before the doctors have had a chance to review them. We understand that in some cases there may be results that are confusing or concerning to you. Please understand that not all results are received at the same time and often the doctors may need to interpret multiple results in order to provide you with the best plan of care or course of treatment. Therefore, we ask that you please give [place] 2 business days to thoroughly review all your results before contacting the office for clarification. Should we see a critical lab result, you will be contacted sooner.   If You Need Anything After Your Visit  If you have any questions or concerns for your doctor, please call our main line at [phone] If no one answers, please leave a voicemail as directed and we will return your call as soon as possible. Messages left after 4 pm will be answered the following business day.   You may also send [place] a message via MyChart. We typically respond to MyChart messages within 1-2 business days.  For prescription refills, please ask your pharmacy  to contact our office. Our fax number is [phone].  If you have an urgent issue when the clinic is closed that cannot wait until the next business day, you can page your doctor at the number below.    Please note that while we do our best to be available for urgent issues outside of office hours, we are not available 24/7.   If you have an urgent issue and are unable to reach [place], you may choose to seek medical care at your doctor's office, retail clinic, urgent care center, or emergency room.  If you have a medical emergency, please immediately call 911 or go to the emergency department. In the event of inclement weather, please call our main line at [phone] for an update on the status of any delays or closures.  Dermatology Medication Tips: Please keep the boxes that topical medications come in in order to help keep track of the instructions about where and how to use these. Pharmacies typically print the medication instructions only on the boxes and not directly on the medication tubes.   If your medication is too expensive, please contact our office at [phone] or send [place] a message through MyChart.   We are unable to tell what your co-pay for medications will be in advance as this is different depending on your insurance coverage. However, we may be able to find a substitute medication at lower cost or fill out paperwork to get insurance to cover  a needed medication.   If a prior authorization is required to get your medication covered by your insurance company, please allow [place] 1-2 business days to complete this process.  Drug prices often vary depending on where the prescription is filled and some pharmacies may offer cheaper prices.  The website www.goodrx.com contains coupons for medications through different pharmacies. The prices here do not account for what the cost may be with help from insurance (it may be cheaper with your insurance), but the website can give you the  price if you did not use any insurance.  - You can print the associated coupon and take it with your prescription to the pharmacy.  - You may also stop by our office during regular business hours and pick up a GoodRx coupon card.  - If you need your prescription sent electronically to a different pharmacy, notify our office through [facility] or by phone at [phone]

## 2024-01-13 ENCOUNTER — Other Ambulatory Visit: Payer: Self-pay

## 2024-01-13 MED ORDER — METRONIDAZOLE 0.75 % EX CREA: TOPICAL_CREAM | Freq: Two times a day (BID) | CUTANEOUS | 4 refills | Status: AC

## 2024-02-02 ENCOUNTER — Other Ambulatory Visit: Payer: Self-pay | Admitting: Family Medicine

## 2024-06-17 ENCOUNTER — Ambulatory Visit: Payer: Medicare Other | Admitting: Family Medicine

## 2024-06-17 ENCOUNTER — Encounter: Payer: Self-pay | Admitting: Family Medicine

## 2024-06-17 VITALS — BP 118/82 | HR 55 | Temp 98.0°F | Ht 62.0 in | Wt 172.0 lb

## 2024-06-17 DIAGNOSIS — Z125 Encounter for screening for malignant neoplasm of prostate: Secondary | ICD-10-CM

## 2024-06-17 DIAGNOSIS — Z0000 Encounter for general adult medical examination without abnormal findings: Secondary | ICD-10-CM

## 2024-06-17 DIAGNOSIS — E669 Obesity, unspecified: Secondary | ICD-10-CM | POA: Diagnosis not present

## 2024-06-17 LAB — CBC WITH DIFFERENTIAL/PLATELET
Basophils Absolute: 0 K/uL (ref 0.0–0.1)
Basophils Relative: 0.6 % (ref 0.0–3.0)
Eosinophils Absolute: 0.1 K/uL (ref 0.0–0.7)
Eosinophils Relative: 2.8 % (ref 0.0–5.0)
HCT: 42.8 % (ref 39.0–52.0)
Hemoglobin: 14.6 g/dL (ref 13.0–17.0)
Lymphocytes Relative: 23.4 % (ref 12.0–46.0)
Lymphs Abs: 0.9 K/uL (ref 0.7–4.0)
MCHC: 34 g/dL (ref 30.0–36.0)
MCV: 96.4 fl (ref 78.0–100.0)
Monocytes Absolute: 0.4 K/uL (ref 0.1–1.0)
Monocytes Relative: 10.4 % (ref 3.0–12.0)
Neutro Abs: 2.5 K/uL (ref 1.4–7.7)
Neutrophils Relative %: 62.8 % (ref 43.0–77.0)
Platelets: 213 K/uL (ref 150.0–400.0)
RBC: 4.44 Mil/uL (ref 4.22–5.81)
RDW: 13.7 % (ref 11.5–15.5)
WBC: 4 K/uL (ref 4.0–10.5)

## 2024-06-17 LAB — TSH: TSH: 1.1 u[IU]/mL (ref 0.35–5.50)

## 2024-06-17 LAB — COMPREHENSIVE METABOLIC PANEL WITH GFR
ALT: 13 U/L (ref 0–53)
AST: 20 U/L (ref 0–37)
Albumin: 4.3 g/dL (ref 3.5–5.2)
Alkaline Phosphatase: 55 U/L (ref 39–117)
BUN: 13 mg/dL (ref 6–23)
CO2: 28 meq/L (ref 19–32)
Calcium: 9.1 mg/dL (ref 8.4–10.5)
Chloride: 103 meq/L (ref 96–112)
Creatinine, Ser: 0.85 mg/dL (ref 0.40–1.50)
GFR: 86.89 mL/min (ref >60.00)
Glucose, Bld: 105 mg/dL — ABNORMAL HIGH (ref 70–99)
Potassium: 4.7 meq/L (ref 3.5–5.1)
Sodium: 141 meq/L (ref 135–145)
Total Bilirubin: 0.8 mg/dL (ref 0.2–1.2)
Total Protein: 7.2 g/dL (ref 6.0–8.3)

## 2024-06-17 LAB — LIPID PANEL
Cholesterol: 190 mg/dL (ref 0–200)
HDL: 90.5 mg/dL (ref >39.00)
LDL Cholesterol: 89 mg/dL (ref 0–99)
NonHDL: 99.84
Total CHOL/HDL Ratio: 2
Triglycerides: 55 mg/dL (ref 0.0–149.0)
VLDL: 11 mg/dL (ref 0.0–40.0)

## 2024-06-17 LAB — PSA: PSA: 2.94 ng/mL (ref 0.10–4.00)

## 2024-06-17 LAB — HEMOGLOBIN A1C: Hgb A1c MFr Bld: 5.8 % (ref 4.6–6.5)

## 2024-06-17 NOTE — Patient Instructions (Addendum)
-  It was great to see you today.  -Physical exam completed today with no concerns.  -Ordered screening labs (CBC, CMP, A1c, Lipid panel, TSH, and PSA). Office will call with lab results and will be available on MyChart. Will follow up sooner if their is major concerns with lab results.  -Continue with a healthy diet and regular exercise.  -Follow up in 1 year for a physical.

## 2024-06-17 NOTE — Progress Notes (Signed)
 Complete physical exam  Patient: Brandon Bernard   DOB: 01/17/1951   73 y.o. Male  MRN: [id number]  Subjective:    Chief Complaint  Patient presents with   Annual Exam    Brandon Bernard is a 72 y.o. male who presents today for a complete physical exam. He reports consuming a  mediterranean diet. Exercise: Working out in the yard, almost every day. He generally feels fairly well. He reports sleeping well. He does not have additional problems to discuss today.    Most recent fall risk assessment:    06/17/2024    8:42 AM  Fall Risk   Falls in the past year? 0  Number falls in past yr: 0  Injury with Fall? 0  Risk for fall due to : No Fall Risks  Follow up Falls evaluation completed     Most recent depression screenings:    06/17/2024    8:42 AM 06/18/2023    9:31 AM  PHQ 2/9 Scores  PHQ - 2 Score 0 0  PHQ- 9 Score 0 0    Vision:Within last year and Dental: No current dental problems and Receives regular dental care  Past Medical History:  Diagnosis Date   Basal cell carcinoma 12/13/2020   sup- right upper back    Basal cell carcinoma 12/13/2020   sup & nod- left flank(EXC)   Concussion    Hepatitis C 2019   treated in Spring 2019    History of chickenpox    Plantar fasciitis    Seasonal allergies    Wears glasses    Past Surgical History:  Procedure Laterality Date   EYE SURGERY Left 1996   Orbital Reconstruction   FRACTURE SURGERY  2014   Left ankle, Right knee, Right wrist, larynx, Lumbar vertebrae, cervical vertebra   HERNIA REPAIR  1994   TONSILLECTOMY     wisdom teeth ext     Social History   Tobacco Use   Smoking status: Never   Smokeless tobacco: Never  Vaping Use   Vaping status: Never Used  Substance Use Topics   Alcohol use: Yes    Alcohol/week: 1.0 standard drink of alcohol    Types: 1 Glasses of wine per week    Comment: 1 glass of wine at holidays   Drug use: Never   Social History   Socioeconomic History   Marital status:  Married    Spouse name: Not on file   Number of children: 0   Years of education: Not on file   Highest education level: Bachelor's degree (e.g., BA, AB, BS)  Occupational History   Occupation: Retired  Tobacco Use   Smoking status: Never   Smokeless tobacco: Never  Vaping Use   Vaping status: Never Used  Substance and Sexual Activity   Alcohol use: Yes    Alcohol/week: 1.0 standard drink of alcohol    Types: 1 Glasses of wine per week    Comment: 1 glass of wine at holidays   Drug use: Never   Sexual activity: Yes    Partners: Female  Other Topics Concern   Not on file  Social History Narrative   Patient lives with his spouse; 2 cats;    Social Drivers of Corporate investment banker Strain: Low Risk  (06/14/2024)   Overall Financial Resource Strain (CARDIA)    Difficulty of Paying Living Expenses: Not hard at all  Food Insecurity: No Food Insecurity (06/14/2024)   Hunger Vital Sign  Worried About Programme researcher, broadcasting/film/video in the Last Year: Never true    Ran Out of Food in the Last Year: Never true  Transportation Needs: No Transportation Needs (06/14/2024)   PRAPARE - Administrator, Civil Service (Medical): No    Lack of Transportation (Non-Medical): No  Physical Activity: Sufficiently Active (06/14/2024)   Exercise Vital Sign    Days of Exercise per Week: 7 days    Minutes of Exercise per Session: 120 min  Stress: No Stress Concern Present (06/14/2024)   [employer] of Occupational Health - Occupational Stress Questionnaire    Feeling of Stress: Not at all  Social Connections: Moderately Integrated (06/14/2024)   Social Connection and Isolation Panel    Frequency of Communication with Friends and Family: More than three times a week    Frequency of Social Gatherings with Friends and Family: More than three times a week    Attends Religious Services: Never    Database administrator or Organizations: Yes    Attends Engineer, structural: More than 4  times per year    Marital Status: Married  Catering manager Violence: Not At Risk (06/18/2023)   Humiliation, Afraid, Rape, and Kick questionnaire    Fear of Current or Ex-Partner: No    Emotionally Abused: No    Physically Abused: No    Sexually Abused: No   Family Status  Relation Name Status   Mother  Deceased   Father  Deceased   MGM  Deceased   MGF  Deceased   PGM  Deceased   PGF  Deceased   Neg Hx  (Not Specified)  No partnership data on file   Family History  Problem Relation Age of Onset   Hypertension Mother    Heart disease Mother    Parkinson's disease Father    Esophageal cancer Neg Hx    Inflammatory bowel disease Neg Hx    Liver disease Neg Hx    Pancreatic cancer Neg Hx    Stomach cancer Neg Hx    Rectal cancer Neg Hx    Colon cancer Neg Hx    Colon polyps Neg Hx    Allergies  Allergen Reactions   Minocycline Other (See Comments)   Patient Care Team: Billy Philippe SAUNDERS, NP as PCP - General (Family Medicine) Alm Delon SAILOR, DO as Consulting Physician (Dermatology) [place] ODESSIA Kays Densitry   Outpatient Medications Prior to Visit  Medication Sig   clindamycin  (CLEOCIN ) 300 MG capsule Take 1 capsule (300 mg total) by mouth daily.   hydrocortisone  2.5 % cream Apply topically 2 (two) times daily as needed (Rash). Apply to insect bites as needed up to two weeks   metroNIDAZOLE  (METROCREAM ) 0.75 % cream Apply topically 2 (two) times daily.   Multiple Vitamin (MULTIVITAMIN) tablet Take 1 tablet by mouth daily.   No facility-administered medications prior to visit.    Review of Systems  Constitutional: Negative.   HENT: Negative.    Eyes: Negative.   Respiratory: Negative.    Cardiovascular: Negative.   Gastrointestinal: Negative.   Genitourinary: Negative.   Musculoskeletal: Negative.   Skin: Negative.   Neurological: Negative.   Endo/Heme/Allergies:  Positive for environmental allergies.  Psychiatric/Behavioral: Negative.     See HPI above     Objective:   BP 118/82   Pulse (!) 55   Temp 98 F (36.7 C) (Oral)   Ht 5' 2 (1.575 m)   Wt 172 lb (78 kg)   SpO2  99%   BMI 31.46 kg/m    Physical Exam   No results found for any visits on 06/17/24.     Assessment & Plan:    Routine Health Maintenance and Physical Exam  Immunization History  Administered Date(s) Administered   Fluad Quad(high Dose 65+) 08/09/2020   Influenza,inj,Quad PF,6+ Mos 07/11/2023   Influenza-Unspecified 07/25/2021, 07/25/2022   PFIZER(Purple Top)SARS-COV-2 Vaccination 12/28/2019, 01/18/2020, 07/17/2020, 03/16/2021, 07/25/2021   Pfizer Covid-19 Vaccine Bivalent Booster 49yrs & up 07/25/2022   Pfizer(Comirnaty)Fall Seasonal Vaccine 12 years and older 07/11/2023   Pneumococcal Conjugate-13 07/26/2019   Pneumococcal Polysaccharide-23 10/30/2017   Tdap 10/22/2011, 11/13/2021   Zoster Recombinant(Shingrix) 11/13/2021, 01/11/2022    Health Maintenance  Topic Date Due   Medicare Annual Wellness (AWV)  01/23/2024   INFLUENZA VACCINE  05/21/2024   Fecal DNA (Cologuard)  11/08/2024   DTaP/Tdap/Td (3 - Td or Tdap) 11/14/2031   Pneumococcal Vaccine: 50+ Years  Completed   Hepatitis C Screening  Completed   Zoster Vaccines- Shingrix  Completed   HPV VACCINES  Aged Out   Meningococcal B Vaccine  Aged Out   COVID-19 Vaccine  Discontinued    Discussed health benefits of physical activity, and encouraged him to engage in regular exercise appropriate for his age and condition.  Annual physical exam  Obesity (BMI 30-39.9) -     CBC with Differential/Platelet -     Comprehensive metabolic panel with GFR -     Lipid panel -     TSH -     Hemoglobin A1c  Prostate cancer screening -     PSA  1.Review health maintenance:  -Influenza vaccine: Will obtain later in the season  -Covid vaccine: Discussed about vaccine 2.Physical exam completed today with no concerns.  3.Ordered screening labs (CBC, CMP, A1c, Lipid panel-fasting, TSH, and PSA). Office  will call with lab results and will be available on MyChart. Will follow up sooner if their is major concerns with lab results.  4.Continue with a healthy diet and regular exercise.   Return in about 1 year (around 06/17/2025) for physical: AWV: either telehealth or in person with Rojelio, LPN ASAP .     Eldo Umanzor, NP

## 2024-06-18 ENCOUNTER — Ambulatory Visit: Payer: Self-pay | Admitting: Family Medicine

## 2024-06-22 ENCOUNTER — Ambulatory Visit

## 2024-06-25 ENCOUNTER — Ambulatory Visit (INDEPENDENT_AMBULATORY_CARE_PROVIDER_SITE_OTHER)

## 2024-06-25 VITALS — Ht 62.0 in | Wt 172.0 lb

## 2024-06-25 DIAGNOSIS — Z0000 Encounter for general adult medical examination without abnormal findings: Secondary | ICD-10-CM

## 2024-06-25 NOTE — Patient Instructions (Addendum)
 Brandon Bernard,  Thank you for taking the time for your Medicare Wellness Visit. I appreciate your continued commitment to your health goals. Please review the care plan we discussed, and feel free to reach out if I can assist you further.  Medicare recommends these wellness visits once per year to help you and your care team stay ahead of potential health issues. These visits are designed to focus on prevention, allowing your provider to concentrate on managing your acute and chronic conditions during your regular appointments.  Please note that Annual Wellness Visits do not include a physical exam. Some assessments may be limited, especially if the visit was conducted virtually. If needed, we may recommend a separate in-person follow-up with your provider.  Ongoing Care Seeing your primary care provider every 3 to 6 months helps us  monitor your health and provide consistent, personalized care.   Referrals If a referral was made during today's visit and you haven't received any updates within two weeks, please contact the referred provider directly to check on the status.  Recommended Screenings:  Health Maintenance  Topic Date Due   Flu Shot  05/21/2024   Cologuard (Stool DNA test)  11/08/2024   Medicare Annual Wellness Visit  06/25/2025   DTaP/Tdap/Td vaccine (3 - Td or Tdap) 11/14/2031   Pneumococcal Vaccine for age over 74  Completed   Hepatitis C Screening  Completed   Zoster (Shingles) Vaccine  Completed   HPV Vaccine  Aged Out   Meningitis B Vaccine  Aged Out   COVID-19 Vaccine  Discontinued       06/25/2024    9:32 AM  Advanced Directives  Does Patient Have a Medical Advance Directive? Yes  Type of Estate agent of [place];Living will  Does patient want to make changes to medical advance directive? No - Patient declined  Copy of Healthcare Power of Attorney in Chart? Yes - validated most recent copy scanned in chart (See row information)   Advance Care  Planning is important because it: Ensures you receive medical care that aligns with your values, goals, and preferences. Provides guidance to your family and loved ones, reducing the emotional burden of decision-making during critical moments.  Vision: Annual vision screenings are recommended for early detection of glaucoma, cataracts, and diabetic retinopathy. These exams can also reveal signs of chronic conditions such as diabetes and high blood pressure.  Dental: Annual dental screenings help detect early signs of oral cancer, gum disease, and other conditions linked to overall health, including heart disease and diabetes.  Please see the attached documents for additional preventive care recommendations.

## 2024-06-25 NOTE — Progress Notes (Signed)
 Subjective:   Brandon Bernard is a 73 y.o. who presents for a Medicare Wellness preventive visit.  As a reminder, Annual Wellness Visits don't include a physical exam, and some assessments may be limited, especially if this visit is performed virtually. We may recommend an in-person follow-up visit with your provider if needed.  Visit Complete: Virtual I connected with  Brandon Bernard on 06/25/24 by a audio enabled telemedicine application and verified that I am speaking with the correct person using two identifiers.  Patient Location: Home  Provider Location: Home Office  I discussed the limitations of evaluation and management by telemedicine. The patient expressed understanding and agreed to proceed.  Vital Signs: Because this visit was a virtual/telehealth visit, some criteria may be missing or patient reported. Any vitals not documented were not able to be obtained and vitals that have been documented are patient reported.    Persons Participating in Visit: Patient.  AWV Questionnaire: No: Patient Medicare AWV questionnaire was not completed prior to this visit.  Cardiac Risk Factors include: advanced age (>53men, >8 women);male gender     Objective:    Today's Vitals   06/25/24 0927  Weight: 172 lb (78 kg)  Height: 5' 2 (1.575 m)   Body mass index is 31.46 kg/m.     06/25/2024    9:32 AM 01/23/2023    8:06 AM 10/18/2021    9:05 AM 07/31/2020    8:18 AM 07/26/2019    9:31 AM 07/22/2018    9:14 AM  Advanced Directives  Does Patient Have a Medical Advance Directive? Yes Yes Yes Yes Yes Yes   Type of Estate agent of [place];Living will Healthcare Power of [employer] of [place];Living will Healthcare Power of [place];Living will Healthcare Power of [place];Living will Healthcare Power of [place];Living will  Does patient want to make changes to medical advance directive? No - Patient declined       Copy of Healthcare Power of  Attorney in Chart? Yes - validated most recent copy scanned in chart (See row information) Yes - validated most recent copy scanned in chart (See row information) No - copy requested No - copy requested No - copy requested No - copy requested      Data saved with a previous flowsheet row definition    Current Medications (verified) Outpatient Encounter Medications as of 06/25/2024  Medication Sig   clindamycin  (CLEOCIN ) 300 MG capsule Take 1 capsule (300 mg total) by mouth daily.   hydrocortisone  2.5 % cream Apply topically 2 (two) times daily as needed (Rash). Apply to insect bites as needed up to two weeks   metroNIDAZOLE  (METROCREAM ) 0.75 % cream Apply topically 2 (two) times daily.   Multiple Vitamin (MULTIVITAMIN) tablet Take 1 tablet by mouth daily.   No facility-administered encounter medications on file as of 06/25/2024.    Allergies (verified) Minocycline   History: Past Medical History:  Diagnosis Date   Basal cell carcinoma 12/13/2020   sup- right upper back    Basal cell carcinoma 12/13/2020   sup & nod- left flank(EXC)   Concussion    Hepatitis C 2019   treated in Spring 2019    History of chickenpox    Plantar fasciitis    Seasonal allergies    Wears glasses    Past Surgical History:  Procedure Laterality Date   EYE SURGERY Left 1996   Orbital Reconstruction   FRACTURE SURGERY  2014   Left ankle, Right knee, Right wrist, larynx,  Lumbar vertebrae, cervical vertebra   HERNIA REPAIR  1994   TONSILLECTOMY     wisdom teeth ext     Family History  Problem Relation Age of Onset   Hypertension Mother    Heart disease Mother    Parkinson's disease Father    Esophageal cancer Neg Hx    Inflammatory bowel disease Neg Hx    Liver disease Neg Hx    Pancreatic cancer Neg Hx    Stomach cancer Neg Hx    Rectal cancer Neg Hx    Colon cancer Neg Hx    Colon polyps Neg Hx    Social History   Socioeconomic History   Marital status: Married    Spouse name: Not on  file   Number of children: 0   Years of education: Not on file   Highest education level: Bachelor's degree (e.g., BA, AB, BS)  Occupational History   Occupation: Retired  Tobacco Use   Smoking status: Never   Smokeless tobacco: Never  Vaping Use   Vaping status: Never Used  Substance and Sexual Activity   Alcohol use: Yes    Alcohol/week: 1.0 standard drink of alcohol    Types: 1 Glasses of wine per week    Comment: 1 glass of wine at holidays   Drug use: Never   Sexual activity: Yes    Partners: Female  Other Topics Concern   Not on file  Social History Narrative   Patient lives with his spouse; 2 cats;    Social Drivers of Corporate investment banker Strain: Low Risk  (06/25/2024)   Overall Financial Resource Strain (CARDIA)    Difficulty of Paying Living Expenses: Not hard at all  Food Insecurity: No Food Insecurity (06/25/2024)   Hunger Vital Sign    Worried About Running Out of Food in the Last Year: Never true    Ran Out of Food in the Last Year: Never true  Transportation Needs: No Transportation Needs (06/25/2024)   PRAPARE - Administrator, Civil Service (Medical): No    Lack of Transportation (Non-Medical): No  Physical Activity: Inactive (06/25/2024)   Exercise Vital Sign    Days of Exercise per Week: 0 days    Minutes of Exercise per Session: 0 min  Stress: No Stress Concern Present (06/25/2024)   [employer] of Occupational Health - Occupational Stress Questionnaire    Feeling of Stress: Not at all  Social Connections: Socially Integrated (06/25/2024)   Social Connection and Isolation Panel    Frequency of Communication with Friends and Family: More than three times a week    Frequency of Social Gatherings with Friends and Family: More than three times a week    Attends Religious Services: More than 4 times per year    Active Member of [employer] or Organizations: Yes    Attends Engineer, structural: More than 4 times per year    Marital  Status: Married    Tobacco Counseling Counseling given: Not Answered    Clinical Intake:  Pre-visit preparation completed: Yes  Pain : No/denies pain     BMI - recorded: 31.46 Nutritional Status: BMI > 30  Obese Nutritional Risks: None Diabetes: No  Lab Results  Component Value Date   HGBA1C 5.8 06/17/2024   HGBA1C 5.3 06/01/2021   HGBA1C 5.7 08/10/2020     How often do you need to have someone help you when you read instructions, pamphlets, or other written materials from your doctor or  pharmacy?: 1 - Never  Interpreter Needed?: No  Information entered by :: Rojelio Blush LPN   Activities of Daily Living     06/25/2024    9:31 AM  In your present state of health, do you have any difficulty performing the following activities:  Hearing? 0  Vision? 0  Difficulty concentrating or making decisions? 0  Walking or climbing stairs? 0  Dressing or bathing? 0  Doing errands, shopping? 0  Preparing Food and eating ? N  Using the Toilet? N  In the past six months, have you accidently leaked urine? N  Do you have problems with loss of bowel control? N  Managing your Medications? N  Managing your Finances? N  Housekeeping or managing your Housekeeping? N    Patient Care Team: Billy Philippe SAUNDERS, NP as PCP - General (Family Medicine) Alm Delon SAILOR, DO as Consulting Physician (Dermatology) [place] ODESSIA Kays Densitry  I have updated your Care Teams any recent Medical Services you may have received from other providers in the past year.     Assessment:   This is a routine wellness examination for Brandon Bernard.  Hearing/Vision screen Hearing Screening - Comments:: Denies hearing difficulties   Vision Screening - Comments:: Wears rx glasses - up to date with routine eye exams with  [facility]   Goals Addressed               This Visit's Progress     Increase physical activity (pt-stated)        Remain active       Depression Screen      06/25/2024    9:31 AM 06/17/2024    8:42 AM 06/18/2023    9:31 AM 01/23/2023    8:11 AM 12/17/2022    9:24 AM 11/13/2022   11:35 AM 06/07/2022    8:18 AM  PHQ 2/9 Scores  PHQ - 2 Score 0 0 0 0 0 0 0  PHQ- 9 Score 0 0 0 0 0 0 0    Fall Risk     06/25/2024    9:31 AM 06/17/2024    8:42 AM 06/18/2023    9:31 AM 01/23/2023    8:06 AM 01/20/2023    5:51 PM  Fall Risk   Falls in the past year? 0 0 0 0 0  Number falls in past yr: 0 0 0 0   Injury with Fall? 0 0 0 0 0  Risk for fall due to : No Fall Risks No Fall Risks No Fall Risks    Follow up Falls evaluation completed Falls evaluation completed Falls evaluation completed Falls evaluation completed;Education provided;Falls prevention discussed     MEDICARE RISK AT HOME:  Medicare Risk at Home Any stairs in or around the home?: Yes If so, are there any without handrails?: No Home free of loose throw rugs in walkways, pet beds, electrical cords, etc?: Yes Adequate lighting in your home to reduce risk of falls?: Yes Life alert?: No Use of a cane, walker or w/c?: No Grab bars in the bathroom?: No Shower chair or bench in shower?: No Elevated toilet seat or a handicapped toilet?: No  TIMED UP AND GO:  Was the test performed?  No  Cognitive Function: 6CIT completed    07/26/2019    9:59 AM 07/22/2018    9:18 AM  MMSE - Mini Mental State Exam  Orientation to time 5 5  Orientation to Place 5 5  Registration 3 3  Attention/ Calculation 5  5  Recall 3 3  Language- name 2 objects 2 2  Language- repeat 1 1  Language- follow 3 step command 3 3  Language- read & follow direction 1 1  Write a sentence 1 1  Copy design 1 1  Total score 30 30        06/25/2024    9:32 AM 01/23/2023    8:09 AM  6CIT Screen  What Year? 0 points 0 points  What month? 0 points 0 points  What time? 0 points 0 points  Count back from 20 0 points 0 points  Months in reverse 0 points 0 points  Repeat phrase 0 points 0 points  Total Score 0 points 0 points     Immunizations Immunization History  Administered Date(s) Administered   Fluad Quad(high Dose 65+) 08/09/2020   Influenza,inj,Quad PF,6+ Mos 07/11/2023   Influenza-Unspecified 07/25/2021, 07/25/2022   PFIZER(Purple Top)SARS-COV-2 Vaccination 12/28/2019, 01/18/2020, 07/17/2020, 03/16/2021, 07/25/2021   Pfizer Covid-19 Vaccine Bivalent Booster 61yrs & up 07/25/2022   Pfizer(Comirnaty)Fall Seasonal Vaccine 12 years and older 07/11/2023   Pneumococcal Conjugate-13 07/26/2019   Pneumococcal Polysaccharide-23 10/30/2017   Tdap 10/22/2011, 11/13/2021   Zoster Recombinant(Shingrix) 11/13/2021, 01/11/2022    Screening Tests Health Maintenance  Topic Date Due   Influenza Vaccine  05/21/2024   Fecal DNA (Cologuard)  11/08/2024   Medicare Annual Wellness (AWV)  06/25/2025   DTaP/Tdap/Td (3 - Td or Tdap) 11/14/2031   Pneumococcal Vaccine: 50+ Years  Completed   Hepatitis C Screening  Completed   Zoster Vaccines- Shingrix  Completed   HPV VACCINES  Aged Out   Meningococcal B Vaccine  Aged Out   COVID-19 Vaccine  Discontinued    Health Maintenance  Health Maintenance Due  Topic Date Due   Influenza Vaccine  05/21/2024   Health Maintenance Items Addressed:   Additional Screening:  Vision Screening: Recommended annual ophthalmology exams for early detection of glaucoma and other disorders of the eye. Would you like a referral to an eye doctor? No    Dental Screening: Recommended annual dental exams for proper oral hygiene  Community Resource Referral / Chronic Care Management: CRR required this visit?  No   CCM required this visit?  No   Plan:    I have personally reviewed and noted the following in the patient's chart:   Medical and social history Use of alcohol, tobacco or illicit drugs  Current medications and supplements including opioid prescriptions. Patient is not currently taking opioid prescriptions. Functional ability and status Nutritional status Physical  activity Advanced directives List of other physicians Hospitalizations, surgeries, and ER visits in previous 12 months Vitals Screenings to include cognitive, depression, and falls Referrals and appointments  In addition, I have reviewed and discussed with patient certain preventive protocols, quality metrics, and best practice recommendations. A written personalized care plan for preventive services as well as general preventive health recommendations were provided to patient.   Rojelio LELON Blush, LPN   [id number]   After Visit Summary: (MyChart) Due to this being a telephonic visit, the after visit summary with patients personalized plan was offered to patient via MyChart   Notes: Nothing significant to report at this time.

## 2024-07-22 ENCOUNTER — Ambulatory Visit: Admitting: Dermatology

## 2024-08-24 ENCOUNTER — Encounter: Payer: Self-pay | Admitting: Family Medicine

## 2024-11-23 ENCOUNTER — Ambulatory Visit: Admitting: Dermatology

## 2024-11-26 ENCOUNTER — Ambulatory Visit: Admitting: Dermatology

## 2024-11-26 VITALS — BP 148/72

## 2024-11-26 DIAGNOSIS — L570 Actinic keratosis: Secondary | ICD-10-CM

## 2024-11-26 MED ORDER — TRIAMCINOLONE ACETONIDE 0.1 % EX CREA: 1.0000 | TOPICAL_CREAM | Freq: Two times a day (BID) | CUTANEOUS | 3 refills | Status: AC

## 2024-11-26 NOTE — Patient Instructions (Signed)

## 2024-11-26 NOTE — Progress Notes (Unsigned)
 "  Follow-Up Visit   Subjective  Brandon Bernard is a 74 y.o. male who presents for the following: Skin Cancer Screening and Full Body Skin Exam - Last OV 01/12/2024 for TBSE and AK treatment - History of BCC   The patient presents for Total-Body Skin Exam (TBSE) for skin cancer screening and mole check. The patient has spots, moles and lesions to be evaluated, some may be new or changing and the patient may have concern these could be cancer.    The following portions of the chart were reviewed this encounter and updated as appropriate: medications, allergies, medical history  Review of Systems:  No other skin or systemic complaints except as noted in HPI or Assessment and Plan.  Objective  Well appearing patient in no apparent distress; mood and affect are within normal limits.  A full examination was performed including scalp, head, eyes, ears, nose, lips, neck, chest, axillae, abdomen, back, buttocks, bilateral upper extremities, bilateral lower extremities, hands, feet, fingers, toes, fingernails, and toenails. All findings within normal limits unless otherwise noted below.   Relevant physical exam findings are noted in the Assessment and Plan.  Right medial canthus x 1, right back x 1 (2) Erythematous thin papules/macules with gritty scale.   Assessment & Plan   SKIN CANCER SCREENING PERFORMED TODAY.  ACTINIC DAMAGE - Chronic condition, secondary to cumulative UV/sun exposure - diffuse scaly erythematous macules with underlying dyspigmentation - Recommend daily broad spectrum sunscreen SPF 30+ to sun-exposed areas, reapply every 2 hours as needed.  - Staying in the shade or wearing long sleeves, sun glasses (UVA+UVB protection) and wide brim hats (4-inch brim around the entire circumference of the hat) are also recommended for sun protection.  - Call for new or changing lesions.  LENTIGINES, SEBORRHEIC KERATOSES, HEMANGIOMAS - Benign normal skin lesions - Benign-appearing -  Call for any changes  MELANOCYTIC NEVI - Tan-brown and/or pink-flesh-colored symmetric macules and papules - Benign appearing on exam today - Observation - Call clinic for new or changing moles - Recommend daily use of broad spectrum spf 30+ sunscreen to sun-exposed areas.   HISTORY OF BASAL CELL CARCINOMA OF THE SKIN (in the 1990s) - No evidence of recurrence today - Recommend regular full body skin exams - Recommend daily broad spectrum sunscreen SPF 30+ to sun-exposed areas, reapply every 2 hours as needed.  - Call if any new or changing lesions are noted between office visits  HISTORY OF SQUAMOUS CELL CARCINOMA OF THE SKIN - No evidence of recurrence today - No lymphadenopathy - Recommend regular full body skin exams - Recommend daily broad spectrum sunscreen SPF 30+ to sun-exposed areas, reapply every 2 hours as needed.  - Call if any new or changing lesions are noted between office visits   FOLLICULITIS Exam: Perifollicular erythematous papules and pustules  Folliculitis occurs due to inflammation of the superficial hair follicle (pore), resulting in acne-like lesions (pus bumps). It can be infectious (bacterial, fungal) or noninfectious (shaving, tight clothing, heat/sweat, medications).  Folliculitis can be acute or chronic and recommended treatment depends on the underlying cause of folliculitis.  Treatment Plan: Clindamycin  lotion Apply to affected areas daily.  GROVERS DISEASE Exam: Pink scaly patches  Treatment Plan: TMC 0.1% Apply to affected areas twice daily x 2 weeks. Take a 2 week break before restarting.   AK (ACTINIC KERATOSIS) (2) Right medial canthus x 1, right back x 1 (2) - Destruction of lesion - Right medial canthus x 1, right back x 1 (2)  Complexity: simple   Destruction method: cryotherapy   Informed consent: discussed and consent obtained   Timeout:  patient name, date of birth, surgical site, and procedure verified Lesion destroyed using  liquid nitrogen: Yes   Region frozen until ice ball extended beyond lesion: Yes   Outcome: patient tolerated procedure well with no complications   Post-procedure details: wound care instructions given      Return in about 1 year (around 11/26/2025) for TBSE.  I, Roseline Hutchinson, CMA, am acting as scribe for [employer], DO .   Documentation: I have reviewed the above documentation for accuracy and completeness, and I agree with the above.  Delon Lenis, DO    "
